# Patient Record
Sex: Female | Born: 1955 | Race: Black or African American | Hispanic: No | Marital: Married | Smoking: Current every day smoker
Health system: Southern US, Community
[De-identification: ages and names within clinical notes are randomized; demographics above are authoritative.]

## PROBLEM LIST (undated history)

## (undated) DIAGNOSIS — J45909 Unspecified asthma, uncomplicated: Secondary | ICD-10-CM

## (undated) DIAGNOSIS — E785 Hyperlipidemia, unspecified: Secondary | ICD-10-CM

## (undated) DIAGNOSIS — K219 Gastro-esophageal reflux disease without esophagitis: Secondary | ICD-10-CM

## (undated) DIAGNOSIS — E669 Obesity, unspecified: Secondary | ICD-10-CM

## (undated) DIAGNOSIS — M722 Plantar fascial fibromatosis: Secondary | ICD-10-CM

## (undated) DIAGNOSIS — I10 Essential (primary) hypertension: Secondary | ICD-10-CM

## (undated) DIAGNOSIS — D649 Anemia, unspecified: Secondary | ICD-10-CM

## (undated) DIAGNOSIS — F419 Anxiety disorder, unspecified: Secondary | ICD-10-CM

## (undated) DIAGNOSIS — R7303 Prediabetes: Secondary | ICD-10-CM

## (undated) DIAGNOSIS — E119 Type 2 diabetes mellitus without complications: Secondary | ICD-10-CM

## (undated) DIAGNOSIS — F1910 Other psychoactive substance abuse, uncomplicated: Secondary | ICD-10-CM

## (undated) DIAGNOSIS — T7840XA Allergy, unspecified, initial encounter: Secondary | ICD-10-CM

## (undated) HISTORY — DX: Type 2 diabetes mellitus without complications: E11.9

## (undated) HISTORY — DX: Plantar fascial fibromatosis: M72.2

## (undated) HISTORY — DX: Prediabetes: R73.03

## (undated) HISTORY — DX: Anxiety disorder, unspecified: F41.9

## (undated) HISTORY — DX: Other psychoactive substance abuse, uncomplicated: F19.10

## (undated) HISTORY — DX: Anemia, unspecified: D64.9

## (undated) HISTORY — DX: Obesity, unspecified: E66.9

## (undated) HISTORY — DX: Allergy, unspecified, initial encounter: T78.40XA

## (undated) HISTORY — DX: Unspecified asthma, uncomplicated: J45.909

## (undated) HISTORY — DX: Hyperlipidemia, unspecified: E78.5

## (undated) HISTORY — DX: Gastro-esophageal reflux disease without esophagitis: K21.9

---
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
## 2000-07-05 ENCOUNTER — Emergency Department (HOSPITAL_COMMUNITY): Admission: EM | Admit: 2000-07-05 | Discharge: 2000-07-05 | Payer: Self-pay | Admitting: Emergency Medicine

## 2000-07-15 ENCOUNTER — Other Ambulatory Visit: Admission: RE | Admit: 2000-07-15 | Discharge: 2000-07-15 | Payer: Self-pay | Admitting: *Deleted

## 2000-07-23 ENCOUNTER — Ambulatory Visit (HOSPITAL_COMMUNITY): Admission: RE | Admit: 2000-07-23 | Discharge: 2000-07-23 | Payer: Self-pay | Admitting: Family Medicine

## 2000-07-23 ENCOUNTER — Encounter: Payer: Self-pay | Admitting: Family Medicine

## 2004-06-27 ENCOUNTER — Emergency Department (HOSPITAL_COMMUNITY): Admission: EM | Admit: 2004-06-27 | Discharge: 2004-06-27 | Payer: Self-pay | Admitting: Emergency Medicine

## 2006-07-10 ENCOUNTER — Emergency Department (HOSPITAL_COMMUNITY): Admission: EM | Admit: 2006-07-10 | Discharge: 2006-07-10 | Payer: Self-pay | Admitting: Family Medicine

## 2007-07-27 ENCOUNTER — Emergency Department (HOSPITAL_COMMUNITY): Admission: EM | Admit: 2007-07-27 | Discharge: 2007-07-27 | Payer: Self-pay | Admitting: Family Medicine

## 2008-01-06 ENCOUNTER — Emergency Department (HOSPITAL_COMMUNITY): Admission: EM | Admit: 2008-01-06 | Discharge: 2008-01-06 | Payer: Self-pay | Admitting: Emergency Medicine

## 2008-08-22 ENCOUNTER — Ambulatory Visit: Payer: Self-pay | Admitting: *Deleted

## 2008-08-22 ENCOUNTER — Encounter (INDEPENDENT_AMBULATORY_CARE_PROVIDER_SITE_OTHER): Payer: Self-pay | Admitting: Internal Medicine

## 2008-08-22 ENCOUNTER — Encounter: Payer: Self-pay | Admitting: Internal Medicine

## 2008-08-22 DIAGNOSIS — R350 Frequency of micturition: Secondary | ICD-10-CM | POA: Insufficient documentation

## 2008-08-22 DIAGNOSIS — F17200 Nicotine dependence, unspecified, uncomplicated: Secondary | ICD-10-CM | POA: Insufficient documentation

## 2008-08-22 DIAGNOSIS — M25519 Pain in unspecified shoulder: Secondary | ICD-10-CM | POA: Insufficient documentation

## 2008-08-22 DIAGNOSIS — D509 Iron deficiency anemia, unspecified: Secondary | ICD-10-CM | POA: Insufficient documentation

## 2008-08-22 DIAGNOSIS — N898 Other specified noninflammatory disorders of vagina: Secondary | ICD-10-CM | POA: Insufficient documentation

## 2008-08-22 DIAGNOSIS — N926 Irregular menstruation, unspecified: Secondary | ICD-10-CM | POA: Insufficient documentation

## 2008-08-22 DIAGNOSIS — J329 Chronic sinusitis, unspecified: Secondary | ICD-10-CM | POA: Insufficient documentation

## 2008-08-22 DIAGNOSIS — L280 Lichen simplex chronicus: Secondary | ICD-10-CM | POA: Insufficient documentation

## 2008-08-22 LAB — CONVERTED CEMR LAB

## 2008-08-23 LAB — CONVERTED CEMR LAB
ALT: 12 units/L (ref 0–35)
AST: 34 units/L (ref 0–37)
Albumin: 4.2 g/dL (ref 3.5–5.2)
Alkaline Phosphatase: 73 units/L (ref 39–117)
BUN: 10 mg/dL (ref 6–23)
Bilirubin Urine: NEGATIVE
CO2: 22 meq/L (ref 19–32)
Calcium: 8.6 mg/dL (ref 8.4–10.5)
Candida species: NEGATIVE
Chlamydia, DNA Probe: NEGATIVE
Chloride: 107 meq/L (ref 96–112)
Creatinine, Ser: 0.75 mg/dL (ref 0.40–1.20)
GC Probe Amp, Genital: NEGATIVE
Gardnerella vaginalis: POSITIVE — AB
Glucose, Bld: 86 mg/dL (ref 70–99)
HCT: 27.6 % — ABNORMAL LOW (ref 36.0–46.0)
Hemoglobin, Urine: NEGATIVE
Hemoglobin: 7.6 g/dL — CL (ref 12.0–15.0)
Ketones, ur: NEGATIVE mg/dL
Leukocytes, UA: NEGATIVE
MCHC: 27.5 g/dL — ABNORMAL LOW (ref 30.0–36.0)
MCV: 65.4 fL — ABNORMAL LOW (ref 78.0–100.0)
Nitrite: NEGATIVE
Platelets: 440 10*3/uL — ABNORMAL HIGH (ref 150–400)
Potassium: 4 meq/L (ref 3.5–5.3)
Protein, ur: NEGATIVE mg/dL
RBC: 4.22 M/uL (ref 3.87–5.11)
RDW: 20.6 % — ABNORMAL HIGH (ref 11.5–15.5)
Sodium: 139 meq/L (ref 135–145)
Specific Gravity, Urine: 1.013 (ref 1.005–1.03)
Total Bilirubin: 0.4 mg/dL (ref 0.3–1.2)
Total Protein: 7 g/dL (ref 6.0–8.3)
Trichomonal Vaginitis: NEGATIVE
Urine Glucose: NEGATIVE mg/dL
Urobilinogen, UA: 0.2 (ref 0.0–1.0)
WBC: 5.6 10*3/uL (ref 4.0–10.5)
pH: 5.5 (ref 5.0–8.0)

## 2008-08-27 ENCOUNTER — Telehealth (INDEPENDENT_AMBULATORY_CARE_PROVIDER_SITE_OTHER): Payer: Self-pay | Admitting: Internal Medicine

## 2008-09-03 ENCOUNTER — Ambulatory Visit: Payer: Self-pay | Admitting: Infectious Diseases

## 2008-09-03 ENCOUNTER — Encounter (INDEPENDENT_AMBULATORY_CARE_PROVIDER_SITE_OTHER): Payer: Self-pay | Admitting: Internal Medicine

## 2008-09-04 LAB — CONVERTED CEMR LAB
Band Neutrophils: 1 % (ref 0–10)
Basophils Relative: 1 % (ref 0–1)
Eosinophils Relative: 1 % (ref 0–5)
Ferritin: 10 ng/mL (ref 10–291)
HCT: 27.6 % — ABNORMAL LOW (ref 36.0–46.0)
Hemoglobin: 8.7 g/dL — ABNORMAL LOW (ref 12.0–15.0)
Lymphocytes Relative: 25 % (ref 12–46)
MCHC: 31.7 g/dL (ref 30.0–36.0)
MCV: 64.2 fL — ABNORMAL LOW (ref 78.0–100.0)
Monocytes Relative: 5 % (ref 3–12)
Neutrophils Relative %: 67 % (ref 43–77)
Platelets: 430 10*3/uL — ABNORMAL HIGH (ref 150–400)
RBC: 4.3 M/uL (ref 3.87–5.11)
RDW: 23.2 % — ABNORMAL HIGH (ref 11.5–15.5)
WBC: 5.5 10*3/uL (ref 4.0–10.5)

## 2008-09-17 ENCOUNTER — Ambulatory Visit: Payer: Self-pay | Admitting: Gastroenterology

## 2008-10-05 HISTORY — PX: COLONOSCOPY: SHX174

## 2008-10-09 ENCOUNTER — Ambulatory Visit: Payer: Self-pay | Admitting: Gastroenterology

## 2008-10-09 ENCOUNTER — Encounter (INDEPENDENT_AMBULATORY_CARE_PROVIDER_SITE_OTHER): Payer: Self-pay | Admitting: Internal Medicine

## 2008-10-22 ENCOUNTER — Ambulatory Visit (HOSPITAL_COMMUNITY): Admission: RE | Admit: 2008-10-22 | Discharge: 2008-10-22 | Payer: Self-pay | Admitting: Obstetrics

## 2008-10-22 ENCOUNTER — Encounter: Payer: Self-pay | Admitting: Internal Medicine

## 2008-11-05 HISTORY — PX: ABDOMINAL HYSTERECTOMY: SHX81

## 2008-12-14 ENCOUNTER — Inpatient Hospital Stay (HOSPITAL_COMMUNITY): Admission: RE | Admit: 2008-12-14 | Discharge: 2008-12-17 | Payer: Self-pay | Admitting: Obstetrics

## 2008-12-14 ENCOUNTER — Encounter: Payer: Self-pay | Admitting: Obstetrics

## 2009-04-16 ENCOUNTER — Emergency Department (HOSPITAL_COMMUNITY): Admission: EM | Admit: 2009-04-16 | Discharge: 2009-04-16 | Payer: Self-pay | Admitting: Emergency Medicine

## 2010-11-04 NOTE — Procedures (Signed)
Summary: Colonoscopy   Colonoscopy  Procedure date:  10/09/2008  Findings:      Location:  [facility].    Procedures Next Due Date:    Colonoscopy: 10/2018  COLONOSCOPY PROCEDURE REPORT  PATIENT:  Terri Powers, Terri Powers  MR#:  [id number] BIRTHDATE:   July 20, 1956   GENDER:   female  ENDOSCOPIST:   Rachael Fee, MD Referred by: Clydie Braun, M.D.  PROCEDURE DATE:  10/09/2008 PROCEDURE:  Colonoscopy, diagnostic ASA CLASS:   Class II INDICATIONS: colorectal cancer screening, average risk   MEDICATIONS:    Versed 10 mg IV, Fentanyl 75 mcg IV  DESCRIPTION OF PROCEDURE:   After the risks benefits and alternatives of the procedure were thoroughly explained, informed consent was obtained.  Digital rectal exam was performed and revealed no abnormalities.   The LB CF-[id number] endoscope was introduced through the anus and advanced to the cecum, which was identified by both the appendix and ileocecal valve, without limitations.  The quality of the prep was good, using MoviPrep.  The instrument was then slowly withdrawn as the colon was fully examined. <<PROCEDUREIMAGES>>    <<OLD IMAGES>>  FINDINGS:  External hemorrhoids were found. Small external hemorrhoids.  The examination was otherwise normal (see image2, image3, and image4).  No polyps or cancers were seen.   Retroflexed views in the rectum revealed no abnormalities.    The scope was then withdrawn from the patient and the procedure completed.  COMPLICATIONS:   None  ENDOSCOPIC IMPRESSION:  1) External hemorrhoids  2) Otherwise normal examination  3) No polyps or cancers  RECOMMENDATIONS:  1) Should follow current colorectal cancer screening guidelines for routine risk patients with a repeat colonoscopy in 10 years.      REPEAT EXAM:   In 10 year(s) for Colonoscopy.   _______________________________ Rachael Fee, MD   CC: Clydie Braun, MD

## 2010-11-04 NOTE — Assessment & Plan Note (Signed)
Summary: COMPL PHYSICAL/PAP SMEAR/VS   Vital Signs:  Patient Profile:   55 Years Old Female Height:     64 inches (162.56 cm) Weight:      216.06 pounds (98.21 kg) BMI:     37.22 Temp:     97.3 degrees F (36.28 degrees C) oral Pulse rate:   73 / minute BP sitting:   131 / 75  (right arm)  Vitals Entered By: Angelina Ok RN (August 22, 2008 10:55 AM)             Is Patient Diabetic? No Nutritional Status BMI of > 30 = obese  Have you ever been in a relationship where you felt threatened, hurt or afraid?No   Does patient need assistance? Functional Status Self care Ambulation Normal     Chief Complaint:  Physical and check up.  History of Present Illness: Terri Powers (this is how she would like to be addressed) presents for a first visit in our clinic for a complete check-up. Her husband was in the hospital under our care and she decided to come to our clinic, too. She did not have a physical in several years.  She has irregular menstrual cycles: every other month, but when she has them they are heavy. She does not have a gynecologist. She has hot flushes. She also notices an unpleasant smell of her vaginal secretions for last 6 months. Agrees for STD testing. She has sinus drainage and coughs constantly. Wonders if this is bronchitis. She has urine loss when coughs. She urinates more often, too. No dysuria. She has a rash on left calf - on and off. Itching, no pain.  Has upper back and bilat. shoulder pain. She would want the flu vaccine today.  Did not have a colonoscopy.She had 1 mammogram in last 10 years.   Depression History:      The patient denies a depressed mood most of the day and a diminished interest in her usual daily activities.        The patient denies that she feels like life is not worth living, denies that she wishes that she were dead, and denies that she has thought about ending her life.            Current Allergies: * HEPATITIS VACCINE  Past  Medical History:    H/o plantar fasciitits    Anemia-iron deficiency 2/2 uterine fibroids     h/o BV 2007    cough 2/2 sinus drainage    allergic rhinitis (spring and autumn spx)       Family History:    Mother - Diabetes, HTN, CHF (b. 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  Sometimes get constipation   Physical Exam  General:     alert and overweight-appearing.   Eyes:     vision grossly intact, pupils equal, pupils round, and pupils reactive to light.   Mouth:     good dentition (except one painful tooth) and pharynx pink and moist.   Lungs:     normal respiratory effort, no crackles, and no wheezes.   Heart:     normal rate, regular rhythm, no murmur, and no gallop.   Abdomen:     soft, non-tender, and normal bowel sounds.   Genitalia:     Pelvic Exam:        External: normal female genitalia without lesions or masses. Mild urethral prolapse.        Vagina: normal without  masses, few small whitish adherent plaques near cervix plaques         Cervix: one punctiform reddish lesion at 12 o'clock        Adnexa: normal bimanual exam without masses or fullness        Uterus: a little dense by internal palpation        Pap  smear: performed Extremities:     no edema Skin:     whole left medial calf area covered by a violaceous, micropapular, pruriginous rash Psych:     normally interactive (a little nervous), good eye contact    Impression & Recommendations:  Problem # 1:  ANEMIA-IRON DEFICIENCY (ICD-280.9) Pt's last documented Hb was 8.8 at the time of an ED visit in 2007 I believe. She did not have a hysteresctomy and notices that her periods are less frequent. I d/w her to await the Hb results and if very low, to refer to Gyn. Orders: T-CBC No Diff ([id number])   Problem # 2:  HEALTH MAINTENANCE EXAM (ICD-V70.0) Will order the routine screening test for this 55 y/o woman (see below).  Will give flu vaccine. Orders: T-Comprehensive Metabolic Panel [phone]) T-CBC No Diff ([id number]) T-Pap Smear, Thin Prep (Y3016) Colonoscopy (Colon) Mammogram (Screening) (Mammo)   Problem # 3:  FREQUENCY, URINARY (ICD-788.41) Pt c/o urinary frequency and having urinary loss when coughing. She has a mild urethral prolapse and it is conceivable to have some degree of incontinence. Will checka  U/A first to see if any UTI. 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 She has had it for a long time and "comes and goes". She has other spots on her body but on much more limited skin areas. Since this is an allergic type of rash, will give HoCortisone cream and advise to try not to scratch. If persists and pruritus worse, can give Atarax/Benadryl. Also, at next visit, advise to wrap leg at night to keep moist (can even use saran wrap).  Problem # 5:  VAGINAL DISCHARGE (ICD-623.5) She c/o malodorous vaginal d/c. Agrees for STD testing. Had clue cell on a wet prep in 2007. Orders: T- GC Chlamydia ([id number]) T-Wet Prep by [facility] [phone])   Problem # 6:  TOBACCO ABUSE  (ICD-305.1) We discussed briefly about the health hazards of tobacco abuse on all body systems. She already cut down to 1/2 PPD from 1 PPD.  Will readdress at next visit.  Problem # 7:  SHOULDER PAIN, BILATERAL (ICD-719.41) Most likely positional. Pt works behind a desk and we discussed position and upper body strengthening exercises with 2, then 5 lb weights if tolerates.  Problem # 8:  POSTNASAL DRIP SYNDROME (ICD-473.9) Pt c/o postnasal drip and cough. Most severe in spring and fall. She is wondering whether this can be bronchitis. I doubt it, but she can have chronic bronchitis changes from smoking. I will not start a cough syrup but I advised her to try an OTC cough supressant for several days but not more than that.   Medications Added to Medication List This Visit: 1)  Cvs Loratadine 10 Mg Tabs (Loratadine) .... Take 1 tablet by mouth once a day 2)  Anti-itch Maximum Strength 1 % Crea (Hydrocortisone) .... Apply as instructed on the tube  Complete Medication List: 1)  Cvs Loratadine 10 Mg Tabs (Loratadine) .... Take 1 tablet by mouth once a day 2)  Anti-itch Maximum Strength 1 % Crea (Hydrocortisone) .... Apply as instructed on the tube  More than 30 min was spent for this encounter.  Patient Instructions: 1)  Please schedule a follow-up appointment in 6 months.   Prescriptions: ANTI-ITCH MAXIMUM STRENGTH 1 % CREA (HYDROCORTISONE) apply as instructed on the tube  #1 x 1   Entered and Authorized by:   Carlus Pavlov MD   Signed by:   Carlus Pavlov MD on 08/22/2008   Method used:   Print then Give to Patient   RxID:   [id number]  ]

## 2011-01-11 LAB — GLUCOSE, CAPILLARY: Glucose-Capillary: 106 mg/dL — ABNORMAL HIGH (ref 70–99)

## 2011-01-15 LAB — CBC
HCT: 30.3 % — ABNORMAL LOW (ref 36.0–46.0)
HCT: 34.3 % — ABNORMAL LOW (ref 36.0–46.0)
Hemoglobin: 11 g/dL — ABNORMAL LOW (ref 12.0–15.0)
Hemoglobin: 9.8 g/dL — ABNORMAL LOW (ref 12.0–15.0)
MCHC: 32 g/dL (ref 30.0–36.0)
MCHC: 32.3 g/dL (ref 30.0–36.0)
MCV: 76 fL — ABNORMAL LOW (ref 78.0–100.0)
MCV: 77.3 fL — ABNORMAL LOW (ref 78.0–100.0)
Platelets: 296 10*3/uL (ref 150–400)
Platelets: 349 10*3/uL (ref 150–400)
RBC: 3.91 MIL/uL (ref 3.87–5.11)
RBC: 4.51 MIL/uL (ref 3.87–5.11)
RDW: 24.5 % — ABNORMAL HIGH (ref 11.5–15.5)
RDW: 25.2 % — ABNORMAL HIGH (ref 11.5–15.5)
WBC: 10.9 10*3/uL — ABNORMAL HIGH (ref 4.0–10.5)
WBC: 5.3 10*3/uL (ref 4.0–10.5)

## 2011-01-15 LAB — PREGNANCY, URINE: Preg Test, Ur: NEGATIVE

## 2011-01-24 ENCOUNTER — Encounter: Payer: Self-pay | Admitting: Cardiovascular Disease

## 2011-01-26 ENCOUNTER — Other Ambulatory Visit (HOSPITAL_COMMUNITY): Payer: Self-pay | Admitting: Cardiovascular Disease

## 2011-01-26 DIAGNOSIS — R55 Syncope and collapse: Secondary | ICD-10-CM

## 2011-01-27 ENCOUNTER — Institutional Professional Consult (permissible substitution): Payer: Self-pay | Admitting: Cardiovascular Disease

## 2011-01-27 ENCOUNTER — Other Ambulatory Visit (HOSPITAL_COMMUNITY): Payer: Self-pay | Admitting: Radiology

## 2011-02-10 ENCOUNTER — Encounter: Payer: Self-pay | Admitting: Cardiovascular Disease

## 2011-02-17 NOTE — Discharge Summary (Signed)
Terri Powers, Terri Powers     ACCOUNT NO.:  [id number]   MEDICAL RECORD NO.:  [id number]          PATIENT TYPE:  INP   LOCATION:  9317                          FACILITY:  [facility]   PHYSICIAN:  Charles A. Clearance Coots, M.D.DATE OF BIRTH:  05/18/1956   DATE OF ADMISSION:  12/14/2008  DATE OF DISCHARGE:  12/17/2008                               DISCHARGE SUMMARY   ADMITTING DIAGNOSIS:  Symptomatic uterine fibroids, menorrhagia.   DISCHARGE DIAGNOSIS:  Symptomatic uterine fibroids, menorrhagia.   REASON FOR ADMISSION:  A 55 year old black female presents with a  history of heavy painful periods.  The patient desired definitive  surgical management.   PAST MEDICAL HISTORY:  Surgery none.   ILLNESSES:  Anemia and seasonal allergies.   MEDICATIONS:  Colace, iron, and Claritin.   ALLERGIES:  No known drug allergies   SOCIAL HISTORY:  Married.  Negative alcohol or recreational drug use.  Positive tobacco.   FAMILY HISTORY:  Remarkable for diabetes.   PHYSICAL EXAMINATION:  GENERAL:  Obese black female in no acute  distress.  VITAL SIGNS:  Temperature 98.4, pulse 80, respiratory rate 18, and blood  pressure 142/80.  HEENT:  Within normal limits.  LUNGS:  Clear to auscultation bilaterally.  HEART:  Regular rate and rhythm.  ABDOMEN:  Soft and nontender.  PELVIC:  Normal external female genitalia.  Vaginal mucosa is normal.  Cervix without discharge, bleeding, or lesions.  Uterus, 13-14 weeks'  size.  Mid position.  There were no adnexal masses appreciated.   IMPRESSION:  Symptomatic uterine fibroids, menorrhagia.   PLAN:  Total abdominal hysterectomy.   ADMITTING LABS:  Hemoglobin 11, hematocrit 34, white blood cell count  5300, and platelets 349,000.   HOSPITAL COURSE:  The patient underwent total abdominal hysterectomy on  December 14, 2008.  There were no intraoperative complications.  Postoperative course was uncomplicated.  The patient was discharged home  on postop day #3 in  good condition.   DISCHARGE LABS:  Hemoglobin 9.8, hematocrit 30, white blood cell count  10,900, and platelets 296,000.   DISCHARGE DISPOSITION:  Discharged home in good condition, status post  total abdominal hysterectomy.   MEDICATIONS:  Ibuprofen and Percocet were prescribed for pain.  Continue  iron.   DISCHARGE INSTRUCTIONS:  Routine written instructions were given for  discharge after hysterectomy.  The patient is to the call office for  followup appointment in 2 weeks.      Charles A. Clearance Coots, M.D.  Electronically Signed     CAH/MEDQ  D:  12/17/2008  T:  12/17/2008  Job:  [id number]

## 2011-02-17 NOTE — Op Note (Signed)
Terri Powers, Terri Powers     ACCOUNT NO.:  [id number]   MEDICAL RECORD NO.:  [id number]          PATIENT TYPE:  INP   LOCATION:  9317                          FACILITY:  [facility]   PHYSICIAN:  Charles A. Clearance Coots, M.D.DATE OF BIRTH:  30-Dec-1955   DATE OF PROCEDURE:  12/14/2008  DATE OF DISCHARGE:                               OPERATIVE REPORT   PREOPERATIVE DIAGNOSES:  Symptomatic uterine fibroids, menorrhagia.   POSTOPERATIVE DIAGNOSES:  Symptomatic uterine fibroids, menorrhagia.   PROCEDURE:  Total abdominal hysterectomy.   SURGEON:  Charles A. Clearance Coots, MD   ASSISTANT:  Roseanna Rainbow, MD   ANESTHESIA:  General.   ESTIMATED BLOOD LOSS:  200 mL.   IV FLUIDS:  42,100 mL.   URINE OUTPUT:  400 mL clear.   COMPLICATIONS:  None.  Foley to gravity.   FINDINGS:  Uterine fibroids.   SPECIMEN:  Uterus with cervix, weight equals 330 g.   DISPOSITION:  Specimen pathology.   OPERATION:  The patient was brought to the operating room and after  satisfactory general endotracheal anesthesia.  The abdomen was prepped  and draped in the usual sterile fashion.  Pfannenstiel skin incision was  made with the scalpel that was deepened down to the fascia with a  scalpel.  Fascia was nicked in the midline and the fascial incision was  extended to the left and to the right with curved Mayo scissors.  The  superior and inferior fascial edges were taken off the rectus muscle  with both blunt sharp dissection.  The rectus muscle was sharply divided  in the midline.  Peritoneum was entered digitally and was digitally  extended to the left and to the right.  The O'Connor-O'Sullivan  retractor was then placed in the incision.  The bowel was packed off and  the anterior and posterior blades were placed.  The cornu of the uterus  was then grasped with Kelly forceps.  The uterus was then elevated.  The  round ligaments were grasped with Kelly forceps and transected with the  Bovie and suture  ligated with transfixion sutures of 0 Vicryl  bilaterally.  The anterior vesicouterine fold of the peritoneum was then  incised with the Bovie and the urinary bladder was pushed down and away  from the lower uterine segment and the cervix.  The broad ligament was  then tented medially, digitally, developing an opening.  The parametrial  clamps were then placed across the utero-ovarian broad ligament and  fallopian tube with a double clamp technique.  The pedicle was then  transected and free tie of 0 Vicryl was placed beneath the clamp  followed by suture ligation with transfixion suture of 0 Vicryl.  Same  procedure was performed on the opposite side without complications.  The  uterine vessels were then isolated and skeletonized bilaterally, doubly  clamped, cut, and suture ligated with transfixion sutures of 0 Vicryl  bilaterally.  The cardinal ligaments were then serially clamped, cut,  and suture ligated with transfixion sutures of 0 Vicryl bilaterally.  The uterosacral ligaments were then clamped, cut, and suture ligated  with transfixion sutures of 0 Vicryl bilaterally and these were held for  further closure of the cuff.  The vaginal cuff was then cross clamped  with 2 parametrial clamps from each corner meeting in the center.  The  cervix and the uterus was then transected away from the vagina and  submitted to pathology for evaluation.  The corners of the vaginal cuff  were closed with transfixion sutures of 0 Vicryl.  The center of the  vaginal cuff was closed with interrupted sutures of 0 Vicryl.  Hemostasis was excellent.  Pelvic cavity was thoroughly irrigated with  warm saline solution.  All clots were removed.  Closure of the vaginal  cuff and all pedicles were again observed for hemostasis, and there was  no active bleeding noted.  The O'Connor-O'Sullivan retractor was then  removed and all packing was removed.  Surgical technician indicated that  all needle, sponge, and  instrument counts were correct.  The abdomen was  then closed as follows.  The parietal peritoneum was closed with  continuous suture of 2-0 Vicryl.  Fascia was closed with continuous  suture of 0 PDS from each corner to the center.  Subcutaneous tissue was  thoroughly irrigated with warm saline solution and all areas of  subcutaneous bleeding were coagulated with the Bovie.  The skin was then  closed with a continuous subcuticular suture of 4-0 Vicryl.  Sterile  bandage was applied to the incision closure.  Surgical technician again  indicated that all needle, sponge, and instrument counts were correct.  The patient tolerated the procedure well, was transported to recovery  room in satisfactory condition.      Charles A. Clearance Coots, M.D.  Electronically Signed     CAH/MEDQ  D:  12/14/2008  T:  12/15/2008  Job:  [id number]

## 2011-08-30 ENCOUNTER — Emergency Department (INDEPENDENT_AMBULATORY_CARE_PROVIDER_SITE_OTHER): Payer: BC Managed Care – PPO

## 2011-08-30 ENCOUNTER — Emergency Department (INDEPENDENT_AMBULATORY_CARE_PROVIDER_SITE_OTHER)
Admission: EM | Admit: 2011-08-30 | Discharge: 2011-08-30 | Disposition: A | Discharge status: Home / Self Care | Payer: BC Managed Care – PPO | Source: Home / Self Care | Attending: Family Medicine | Admitting: Family Medicine

## 2011-08-30 ENCOUNTER — Encounter (HOSPITAL_COMMUNITY): Payer: Self-pay | Admitting: *Deleted

## 2011-08-30 DIAGNOSIS — S90121A Contusion of right lesser toe(s) without damage to nail, initial encounter: Secondary | ICD-10-CM

## 2011-08-30 DIAGNOSIS — S90129A Contusion of unspecified lesser toe(s) without damage to nail, initial encounter: Secondary | ICD-10-CM

## 2011-08-30 NOTE — ED Notes (Signed)
Reports stubbing right 2nd toe at work approx 3-4 days ago.  C/O continued pain and swelling.  Has been taking IBU.

## 2011-08-30 NOTE — ED Provider Notes (Signed)
History     CSN: [id number] Arrival date & time: 08/30/2011  9:37 AM   First MD Initiated Contact with Patient 08/30/11 [phone]      Chief Complaint  Patient presents with  . Toe Injury    (Consider location/radiation/quality/duration/timing/severity/associated sxs/prior treatment) Patient is a 55 y.o. female presenting with toe pain.  Toe Pain This is a new problem. The current episode started more than 2 days ago (struck object on tues, still painful.). The problem occurs constantly. The problem has not changed since onset.The symptoms are aggravated by walking.    Past Medical History  Diagnosis Date  . Plantar fasciitis   . Anemia   . Allergic rhinitis     Past Surgical History  Procedure Date  . Abdominal hysterectomy     Family History  Problem Relation Age of Onset  . Diabetes    . Hypertension    . Heart failure    . Kidney cancer      History  Substance Use Topics  . Smoking status: Current Everyday Smoker -- 0.5 packs/day  . Smokeless tobacco: Not on file  . Alcohol Use: Yes     Occasional use    OB History    Grav Para Term Preterm Abortions TAB SAB Ect Mult Living                  Review of Systems  Constitutional: Negative.   Musculoskeletal: Positive for joint swelling and gait problem.  Skin: Negative.     Allergies  Shellfish allergy  Home Medications  No current outpatient prescriptions on file.  BP 147/78  Pulse 79  Temp(Src) 98.1 F (36.7 C) (Oral)  Resp 17  SpO2 100%  Physical Exam  Nursing note and vitals reviewed. Constitutional: She appears well-developed and well-nourished.  Musculoskeletal:       Feet:    ED Course  Procedures (including critical care time)  Labs Reviewed - No data to display No results found.   1. Contusion of toe of right foot       MDM  X-rays reviewed and report per radiologist.         Barkley Bruns, MD 09/18/11 [phone]

## 2013-05-22 ENCOUNTER — Emergency Department (HOSPITAL_COMMUNITY)
Admission: EM | Admit: 2013-05-22 | Discharge: 2013-05-22 | Disposition: A | Discharge status: Home / Self Care | Payer: BC Managed Care – PPO | Source: Home / Self Care | Attending: Emergency Medicine | Admitting: Emergency Medicine

## 2013-05-22 ENCOUNTER — Encounter (HOSPITAL_COMMUNITY): Payer: Self-pay | Admitting: *Deleted

## 2013-05-22 DIAGNOSIS — H1089 Other conjunctivitis: Secondary | ICD-10-CM

## 2013-05-22 DIAGNOSIS — A499 Bacterial infection, unspecified: Secondary | ICD-10-CM

## 2013-05-22 DIAGNOSIS — H109 Unspecified conjunctivitis: Secondary | ICD-10-CM

## 2013-05-22 MED ORDER — OFLOXACIN 0.3 % OP SOLN: 2.0000 [drp] | Freq: Four times a day (QID) | OPHTHALMIC | Status: DC

## 2013-05-22 MED ORDER — TETRACAINE HCL 0.5 % OP SOLN
OPHTHALMIC | Status: AC
  Filled 2013-05-22: qty 2

## 2013-05-22 NOTE — ED Provider Notes (Signed)
CSN: [id number]     Arrival date & time 05/22/13  1226 History     First MD Initiated Contact with Patient 05/22/13 1311     Chief Complaint  Patient presents with  . Eye Problem   (Consider location/radiation/quality/duration/timing/severity/associated sxs/prior Treatment) HPI Comments: Foreign body sensation, gloopy eyes, red right eye for a few days  57 year old female presents complaining of increasing irritation in her right eye with purulent discharge and foreign body sensation for the past several days. She does have sick contacts with conjunctivitis and she believe she may have caught it. She has not tried doing anything to treat this problem at home. She denies blurry vision, pain in the eyeball itself, fever, chills, headache, or any other symptoms. She does wear contacts but has not used them since this began and she did throw away the contacts she was using when this began  Patient is a 57 y.o. female presenting with eye problem.  Eye Problem Associated symptoms: discharge and redness   Associated symptoms: no nausea, no photophobia, no vomiting and no weakness     Past Medical History  Diagnosis Date  . Plantar fasciitis   . Anemia   . Allergic rhinitis    Past Surgical History  Procedure Laterality Date  . Abdominal hysterectomy     Family History  Problem Relation Age of Onset  . Diabetes    . Hypertension    . Heart failure    . Kidney cancer     History  Substance Use Topics  . Smoking status: Current Every Day Smoker -- 0.50 packs/day  . Smokeless tobacco: Not on file  . Alcohol Use: Yes     Comment: Occasional use   OB History   Grav Para Term Preterm Abortions TAB SAB Ect Mult Living                 Review of Systems  Constitutional: Negative for fever and chills.  Eyes: Positive for pain, discharge and redness. Negative for photophobia and visual disturbance.  Respiratory: Negative for cough and shortness of breath.   Cardiovascular: Negative  for chest pain, palpitations and leg swelling.  Gastrointestinal: Negative for nausea, vomiting and abdominal pain.  Endocrine: Negative for polydipsia and polyuria.  Genitourinary: Negative for dysuria, urgency and frequency.  Musculoskeletal: Negative for myalgias and arthralgias.  Skin: Negative for rash.  Neurological: Negative for dizziness, weakness and light-headedness.    Allergies  Shellfish allergy  Home Medications   Current Outpatient Rx  Name  Route  Sig  Dispense  Refill  . ofloxacin (OCUFLOX) 0.3 % ophthalmic solution   Right Eye   Place 2 drops into the right eye 4 (four) times daily.   10 mL   0    BP 150/79  Pulse 85  Temp(Src) 98.1 F (36.7 C) (Oral)  Resp 16  SpO2 100% Physical Exam  Nursing note and vitals reviewed. Constitutional: She is oriented to person, place, and time. She appears well-developed and well-nourished. No distress.  HENT:  Head: Normocephalic and atraumatic.  Eyes: EOM are normal. Right eye exhibits discharge. Left eye exhibits no discharge. Right conjunctiva is injected. Left conjunctiva is not injected.  Slit lamp exam:      The right eye shows no corneal abrasion, no corneal ulcer, no foreign body and no fluorescein uptake.  Pulmonary/Chest: Effort normal.  Neurological: She is alert and oriented to person, place, and time.  Skin: Skin is warm and dry. No rash noted. She is not  diaphoretic.  Psychiatric: She has a normal mood and affect. Judgment normal.    ED Course   Procedures (including critical care time)  Labs Reviewed - No data to display No results found. 1. Bacterial conjunctivitis of right eye     MDM  Bacterial conjunctivitis, no evidence of corneal ulceration or abrasion, or foreign body. Contact use, Ocuflox, followup with eye doctor if not improving  Meds ordered this encounter  Medications  . ofloxacin (OCUFLOX) 0.3 % ophthalmic solution    Sig: Place 2 drops into the right eye 4 (four) times daily.     Dispense:  10 mL    Refill:  0     Graylon Good, PA-C 05/23/13 1045

## 2013-05-22 NOTE — ED Notes (Signed)
Pt  Reports  r  Eye  Is  Red  Irritated      X  sev  Days  The  Pt  Reports  She  Wears  Contacts  -    She  Removed  The  Contacts        She  denys  Any other  Symptoms   She  Is       Sitting  Upright on  The  Exam table       Sitting up and  Is  In no  Acute  Distress

## 2013-05-23 NOTE — ED Provider Notes (Signed)
Medical screening examination/treatment/procedure(s) were performed by non-physician practitioner and as supervising physician I was immediately available for consultation/collaboration.  Raydan Schlabach, M.D.  Marceil Welp C Casady Voshell, MD 05/23/13 1707 

## 2014-01-17 ENCOUNTER — Emergency Department (HOSPITAL_COMMUNITY)
Admission: EM | Admit: 2014-01-17 | Discharge: 2014-01-17 | Disposition: A | Discharge status: Home / Self Care | Payer: BC Managed Care – PPO | Source: Home / Self Care

## 2014-01-17 ENCOUNTER — Encounter (HOSPITAL_COMMUNITY): Payer: Self-pay | Admitting: Emergency Medicine

## 2014-01-17 DIAGNOSIS — J309 Allergic rhinitis, unspecified: Secondary | ICD-10-CM

## 2014-01-17 DIAGNOSIS — J45909 Unspecified asthma, uncomplicated: Secondary | ICD-10-CM

## 2014-01-17 MED ORDER — ALBUTEROL SULFATE HFA 108 (90 BASE) MCG/ACT IN AERS: 1.0000 | INHALATION_SPRAY | Freq: Four times a day (QID) | RESPIRATORY_TRACT | Status: DC | PRN

## 2014-01-17 MED ORDER — TRIAMCINOLONE ACETONIDE 40 MG/ML IJ SUSP
INTRAMUSCULAR | Status: AC
  Filled 2014-01-17: qty 2

## 2014-01-17 MED ORDER — TRIAMCINOLONE ACETONIDE 40 MG/ML IJ SUSP
60.0000 mg | Freq: Once | INTRAMUSCULAR | Status: AC
  Administered 2014-01-17: 60 mg via INTRAMUSCULAR

## 2014-01-17 NOTE — ED Notes (Signed)
Congestion, cough, allergies x 1 week

## 2014-01-17 NOTE — ED Provider Notes (Signed)
CSN: [id number]     Arrival date & time 01/17/14  1045 History   First MD Initiated Contact with Patient 01/17/14 1200     Chief Complaint  Patient presents with  . Nasal Congestion  . Cough   (Consider location/radiation/quality/duration/timing/severity/associated sxs/prior Treatment) HPI Comments: C/O allergies that occur seasonally.   Past Medical History  Diagnosis Date  . Plantar fasciitis   . Anemia   . Allergic rhinitis    Past Surgical History  Procedure Laterality Date  . Abdominal hysterectomy     Family History  Problem Relation Age of Onset  . Diabetes    . Hypertension    . Heart failure    . Kidney cancer     History  Substance Use Topics  . Smoking status: Current Every Day Smoker -- 0.50 packs/day  . Smokeless tobacco: Not on file  . Alcohol Use: Yes     Comment: Occasional use   OB History   Grav Para Term Preterm Abortions TAB SAB Ect Mult Living                 Review of Systems  Constitutional: Negative for fever, chills, activity change, appetite change and fatigue.  HENT: Positive for congestion, postnasal drip, rhinorrhea and sinus pressure. Negative for facial swelling.   Eyes: Negative.   Respiratory: Positive for cough. Negative for shortness of breath.   Cardiovascular: Negative.   Gastrointestinal: Negative.   Musculoskeletal: Negative for neck pain and neck stiffness.  Skin: Negative for pallor and rash.  Neurological: Negative.     Allergies  Shellfish allergy  Home Medications   Prior to Admission medications   Medication Sig Start Date End Date Taking? Authorizing Provider  ofloxacin (OCUFLOX) 0.3 % ophthalmic solution Place 2 drops into the right eye 4 (four) times daily. 05/22/13   Freeman Caldron Baker, PA-C   BP 157/73  Pulse 71  Temp(Src) 98 F (36.7 C) (Oral)  Resp 16  SpO2 100% Physical Exam  Nursing note and vitals reviewed. Constitutional: She is oriented to person, place, and time. She appears well-developed and  well-nourished. No distress.  HENT:  Head: Normocephalic.  Right Ear: External ear normal.  Left Ear: External ear normal.  Minor erythema of the posterior oropharynx. Cobblestoning. No exudates or edema. Clear PND  Eyes: Conjunctivae and EOM are normal.  Neck: Normal range of motion. Neck supple.  Cardiovascular: Normal rate and regular rhythm.   No murmur heard. Pulmonary/Chest: Effort normal. No respiratory distress. She has no rales.  Coarseness on expiration  Musculoskeletal: Normal range of motion.  Lymphadenopathy:    She has no cervical adenopathy.  Neurological: She is alert and oriented to person, place, and time. No cranial nerve deficit.  Skin: Skin is warm and dry.  Psychiatric: She has a normal mood and affect.    ED Course  Procedures (including critical care time) Labs Review Labs Reviewed - No data to display  No results found.   MDM   1. Allergic rhinosinusitis   2. RAD (reactive airway disease) with wheezing      Kenalog 60 mg IM Albuterol HFA OTC meds for allergies listed for her Lots of fluids      Janne Napoleon, NP 01/17/14 1248

## 2014-01-17 NOTE — Discharge Instructions (Signed)
Allergic Rhinitis Allegra 180 mg  A day Sudafed PE for congestion For night time drainage may add chlorphenermine (Chlor-Trimeton) Flonase nasal spray Lots of saline nasal spray Bronchospasm, Adult A bronchospasm is a spasm or tightening of the airways going into the lungs. During a bronchospasm breathing becomes more difficult because the airways get smaller. When this happens there can be coughing, a whistling sound when breathing (wheezing), and difficulty breathing. Bronchospasm is often associated with asthma, but not all patients who experience a bronchospasm have asthma. CAUSES  A bronchospasm is caused by inflammation or irritation of the airways. The inflammation or irritation may be triggered by:   Allergies (such as to animals, pollen, food, or mold). Allergens that cause bronchospasm may cause wheezing immediately after exposure or many hours later.   Infection. Viral infections are believed to be the most common cause of bronchospasm.   Exercise.   Irritants (such as pollution, cigarette smoke, strong odors, aerosol sprays, and paint fumes).   Weather changes. Winds increase molds and pollens in the air. Rain refreshes the air by washing irritants out. Cold air may cause inflammation.   Stress and emotional upset.  SIGNS AND SYMPTOMS   Wheezing.   Excessive nighttime coughing.   Frequent or severe coughing with a simple cold.   Chest tightness.   Shortness of breath.  DIAGNOSIS  Bronchospasm is usually diagnosed through a history and physical exam. Tests, such as chest X-rays, are sometimes done to look for other conditions. TREATMENT   Inhaled medicines can be given to open up your airways and help you breathe. The medicines can be given using either an inhaler or a nebulizer machine.  Corticosteroid medicines may be given for severe bronchospasm, usually when it is associated with asthma. HOME CARE INSTRUCTIONS   Always have a plan prepared for  seeking medical care. Know when to call your health care provider and local emergency services (911 in the [place] Know where you can access local emergency care.  Only take medicines as directed by your health care provider.  If you were prescribed an inhaler or nebulizer machine, ask your health care provider to explain how to use it correctly. Always use a spacer with your inhaler if you were given one.  It is necessary to remain calm during an attack. Try to relax and breathe more slowly.  Control your home environment in the following ways:   Change your heating and air conditioning filter at least once a month.   Limit your use of fireplaces and wood stoves.  Do not smoke and do not allow smoking in your home.   Avoid exposure to perfumes and fragrances.   Get rid of pests (such as roaches and mice) and their droppings.   Throw away plants if you see mold on them.   Keep your house clean and dust free.   Replace carpet with wood, tile, or vinyl flooring. Carpet can trap dander and dust.   Use allergy-proof pillows, mattress covers, and box spring covers.   Wash bed sheets and blankets every week in hot water and dry them in a dryer.   Use blankets that are made of polyester or cotton.   Wash hands frequently. SEEK MEDICAL CARE IF:   You have muscle aches.   You have chest pain.   The sputum changes from clear or white to yellow, green, gray, or bloody.   The sputum you cough up gets thicker.   There are problems that may be related to  the medicine you are given, such as a rash, itching, swelling, or trouble breathing.  SEEK IMMEDIATE MEDICAL CARE IF:   You have worsening wheezing and coughing even after taking your prescribed medicines.   You have increased difficulty breathing.   You develop severe chest pain. MAKE SURE YOU:   Understand these instructions.  Will watch your condition.  Will get help right away if you are not doing well  or get worse. Document Released: 09/24/2003 Document Revised: 05/24/2013 Document Reviewed: 03/13/2013 [facility] Patient Information 2014 [place].  How to Use an Inhaler Using your inhaler correctly is very important. Good technique will make sure that the medicine reaches your lungs.  HOW TO USE AN INHALER: 1. Take the cap off the inhaler. 2. If this is the first time using your inhaler, you need to prime it. Shake the inhaler for 5 seconds. Release four puffs into the air, away from your face. Ask your doctor for help if you have questions. 3. Shake the inhaler for 5 seconds. 4. Turn the inhaler so the bottle is above the mouthpiece. 5. Put your pointer finger on top of the bottle. Your thumb holds the bottom of the inhaler. 6. Open your mouth. 7. Either hold the inhaler away from your mouth (the width of 2 fingers) or place your lips tightly around the mouthpiece. Ask your doctor which way to use your inhaler. 8. Breathe out as much air as possible. 9. Breathe in and push down on the bottle 1 time to release the medicine. You will feel the medicine go in your mouth and throat. 10. Continue to take a deep breath in very slowly. Try to fill your lungs. 11. After you have breathed in completely, hold your breath for 10 seconds. This will help the medicine to settle in your lungs. If you cannot hold your breath for 10 seconds, hold it for as long as you can before you breathe out. 12. Breathe out slowly, through pursed lips. Whistling is an example of pursed lips. 13. If your doctor has told you to take more than 1 puff, wait at least 15 30 seconds between puffs. This will help you get the best results from your medicine. Do not use the inhaler more than your doctor tells you to. 14. Put the cap back on the inhaler. 15. Follow the directions from your doctor or from the inhaler package about cleaning the inhaler. If you use more than one inhaler, ask your doctor which inhalers to use and  what order to use them in. Ask your doctor to help you figure out when you will need to refill your inhaler.  If you use a steroid inhaler, always rinse your mouth with water after your last puff, gargle and spit out the water. Do not swallow the water. GET HELP IF:  The inhaler medicine only partially helps to stop wheezing or shortness of breath.  You are having trouble using your inhaler.  You have some increase in thick spit (phlegm). GET HELP RIGHT AWAY IF:  The inhaler medicine does not help your wheezing or shortness of breath or you have tightness in your chest.  You have dizziness, headaches, or fast heart rate.  You have chills, fever, or night sweats.  You have a large increase of thick spit, or your thick spit is bloody. MAKE SURE YOU:   Understand these instructions.  Will watch your condition.  Will get help right away if you are not doing well or get worse.  Document Released: 06/30/2008 Document Revised: 07/12/2013 Document Reviewed: 04/20/2013 [facility] Patient Information 2014 [place], [place].  Allergic rhinitis is when the mucous membranes in the nose respond to allergens. Allergens are particles in the air that cause your body to have an allergic reaction. This causes you to release allergic antibodies. Through a chain of events, these eventually cause you to release histamine into the blood stream. Although meant to protect the body, it is this release of histamine that causes your discomfort, such as frequent sneezing, congestion, and an itchy, runny nose.  CAUSES  Seasonal allergic rhinitis (hay fever) is caused by pollen allergens that may come from grasses, trees, and weeds. Year-round allergic rhinitis (perennial allergic rhinitis) is caused by allergens such as house dust mites, pet dander, and mold spores.  SYMPTOMS   Nasal stuffiness (congestion).  Itchy, runny nose with sneezing and tearing of the eyes. DIAGNOSIS  Your health care provider can help you  determine the allergen or allergens that trigger your symptoms. If you and your health care provider are unable to determine the allergen, skin or blood testing may be used. TREATMENT  Allergic Rhinitis does not have a cure, but it can be controlled by:  Medicines and allergy shots (immunotherapy).  Avoiding the allergen. Hay fever may often be treated with antihistamines in pill or nasal spray forms. Antihistamines block the effects of histamine. There are over-the-counter medicines that may help with nasal congestion and swelling around the eyes. Check with your health care provider before taking or giving this medicine.  If avoiding the allergen or the medicine prescribed do not work, there are many new medicines your health care provider can prescribe. Stronger medicine may be used if initial measures are ineffective. Desensitizing injections can be used if medicine and avoidance does not work. Desensitization is when a patient is given ongoing shots until the body becomes less sensitive to the allergen. Make sure you follow up with your health care provider if problems continue. HOME CARE INSTRUCTIONS It is not possible to completely avoid allergens, but you can reduce your symptoms by taking steps to limit your exposure to them. It helps to know exactly what you are allergic to so that you can avoid your specific triggers. SEEK MEDICAL CARE IF:   You have a fever.  You develop a cough that does not stop easily (persistent).  You have shortness of breath.  You start wheezing.  Symptoms interfere with normal daily activities. Document Released: 06/16/2001 Document Revised: 07/12/2013 Document Reviewed: 05/29/2013 [facility] Patient Information 2014 [place].

## 2014-01-18 NOTE — ED Provider Notes (Signed)
Medical screening examination/treatment/procedure(s) were performed by non-physician practitioner and as supervising physician I was immediately available for consultation/collaboration.  Philipp Deputy, M.D.  Harden Mo, MD 01/18/14 1344

## 2015-08-13 ENCOUNTER — Encounter: Payer: Self-pay | Admitting: Gastroenterology

## 2016-04-22 ENCOUNTER — Encounter: Payer: Self-pay | Admitting: Internal Medicine

## 2016-04-22 ENCOUNTER — Ambulatory Visit (INDEPENDENT_AMBULATORY_CARE_PROVIDER_SITE_OTHER): Payer: Self-pay | Admitting: Internal Medicine

## 2016-04-22 VITALS — BP 178/100 | HR 80 | Temp 98.1°F | Resp 18 | Ht 61.0 in | Wt 250.0 lb

## 2016-04-22 DIAGNOSIS — Z658 Other specified problems related to psychosocial circumstances: Secondary | ICD-10-CM

## 2016-04-22 DIAGNOSIS — R7303 Prediabetes: Secondary | ICD-10-CM

## 2016-04-22 DIAGNOSIS — F439 Reaction to severe stress, unspecified: Secondary | ICD-10-CM

## 2016-04-22 DIAGNOSIS — I10 Essential (primary) hypertension: Secondary | ICD-10-CM

## 2016-04-22 LAB — GLUCOSE, POCT (MANUAL RESULT ENTRY): POC Glucose: 89 mg/dl (ref 70–99)

## 2016-04-22 MED ORDER — LISINOPRIL 10 MG PO TABS: 10.0000 mg | ORAL_TABLET | Freq: Every day | ORAL | Status: DC

## 2016-04-22 NOTE — Patient Instructions (Addendum)
Can google "advance directives, [place]"  And bring up form from Secretary of State. Print and fill out Or can go to "5 wishes"  Which is also in Spanish and fill out--this costs $5--perhaps easier to use. Designate a Medical Power of Attorney to speak for you if you are unable to speak for yourself when ill or injured  Drink a glass of water before every meal Drink 6-8 glasses of water daily Eat three meals daily Eat a protein and healthy fat with every meal (eggs,fish, chicken, turkey and limit red meats) Eat 5 servings of vegetables daily, mix the colors Eat 2 servings of fruit daily with skin, if skin is edible Use smaller plates Put food/utensils down as you chew and swallow each bite Eat at a table with friends/family at least once daily, no TV Do not eat in front of the TV 

## 2016-04-22 NOTE — Progress Notes (Signed)
Subjective:    Patient ID: Terri Powers, female    DOB: 31-Aug-1956, 60 y.o.   MRN: [id number]  HPI  New patient here to establish.  1. Essential Hypertension:  Diagnosed 1 year ago.  Does have family history.  Was not started on medication at the time as she was closer to "borderline"  2.  Prediabetes:  Also diagnosed at physical 1 year ago and started on Metformin twice daily.  She did have one follow up.  Not clear her sugar control was any better.  No history she is aware of of elevated cholesterol Has hDad pneumococcal vaccine Did not get flu vaccine last year. No symptoms of DM complications. Did have eye evaluation last year and no diabetic changes--[facility]  July 2016. No history of cardiac, cerebrovascular or renal disease related.  3.  Seasonal and Environmental Allergies:  Has both pollen and dust allergies.  Allergic to cat dander as well.  Rotating between Zyrtec/Claritin and Rhinocort and Flonase.  Only uses nasal spray during spring and fall.    Current outpatient prescriptions:  .  acetaminophen (TYLENOL) 500 MG tablet, Take 1,000 mg by mouth every 6 (six) hours as needed for mild pain., Disp: , Rfl:  .  albuterol (PROVENTIL HFA;VENTOLIN HFA) 108 (90 BASE) MCG/ACT inhaler, Inhale 1-2 puffs into the lungs every 6 (six) hours as needed for wheezing or shortness of breath., Disp: 1 Inhaler, Rfl: 0 .  budesonide (RHINOCORT ALLERGY) 32 MCG/ACT nasal spray, Place 1 spray into both nostrils daily., Disp: , Rfl:  .  cetirizine (ZYRTEC) 10 MG tablet, Take 10 mg by mouth daily., Disp: , Rfl:  .  fluticasone (FLONASE) 50 MCG/ACT nasal spray, Place 2 sprays into both nostrils daily., Disp: , Rfl:  .  loratadine (CLARITIN) 10 MG tablet, Take 10 mg by mouth daily., Disp: , Rfl:  .  pseudoephedrine (SUDAFED) 30 MG tablet, Take 30 mg by mouth every 4 (four) hours as needed for congestion., Disp: , Rfl:    Allergies  Allergen Reactions  . Peach Flavor Hives  .  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 Plantar fasciitis   . Anemia   . Allergic rhinitis   . Allergy     Environmental   Past Surgical History  Procedure Laterality Date  . Abdominal hysterectomy  11/2008    TAH and BSO for fibroids                  Review of Systems     Objective:   Physical Exam White flat top Obese NAD HEENT:  PERRL, EOMI, TMs pearly gray, throat without injection, nasal mucosa swollen and boggy,  Neck:  Supple, no adenopathy Chest:  CTA CV:  RRR with normal S1 and S2, No S3, S4 or murmur, radial pulses normal and equal.  No LE edema Abd:  S, NT, No HSM or mass, +BS throughout.       Assessment & Plan:  1.  Essential Hypertension:  With prediabetes, start Lisinopril 10 mg daily.  BP check and BMP in 1 week.  Check  CMP today.  2.  Prediabetes: check A1C and FLP today.  Long discussion of diet and physical activity.  If still in prediabetic range, would like information on the Diabetic prevention work group to start soon. Eye referral if in diabetic range with A1C  3.  Allergies:  Encouraged corticosteroid nasal spray daily during the seasonal  allergy time periods and to use one of her antihistamine regularly until it seems less effective, then switch to another, but to avoid switching every other day.  4.  Stress:  Referral to Macie Burows, LCSW/  5.  Tobacco Abuse:  Not ready to quit today.

## 2016-04-23 LAB — COMPREHENSIVE METABOLIC PANEL
ALT: 9 IU/L (ref 0–32)
AST: 11 IU/L (ref 0–40)
Albumin/Globulin Ratio: 1.6 (ref 1.2–2.2)
Albumin: 4.2 g/dL (ref 3.5–5.5)
Alkaline Phosphatase: 92 IU/L (ref 39–117)
BUN/Creatinine Ratio: 14 (ref 9–23)
BUN: 10 mg/dL (ref 6–24)
Bilirubin Total: 0.3 mg/dL (ref 0.0–1.2)
CO2: 22 mmol/L (ref 18–29)
Calcium: 9.1 mg/dL (ref 8.7–10.2)
Chloride: 100 mmol/L (ref 96–106)
Creatinine, Ser: 0.7 mg/dL (ref 0.57–1.00)
GFR calc Af Amer: 111 mL/min/{1.73_m2} (ref >59)
GFR calc non Af Amer: 96 mL/min/{1.73_m2} (ref >59)
Globulin, Total: 2.7 g/dL (ref 1.5–4.5)
Glucose: 83 mg/dL (ref 65–99)
Potassium: 4.2 mmol/L (ref 3.5–5.2)
Sodium: 142 mmol/L (ref 134–144)
Total Protein: 6.9 g/dL (ref 6.0–8.5)

## 2016-04-23 LAB — LIPID PANEL W/O CHOL/HDL RATIO
Cholesterol, Total: 243 mg/dL — ABNORMAL HIGH (ref 100–199)
HDL: 52 mg/dL (ref >39)
LDL Calculated: 179 mg/dL — ABNORMAL HIGH (ref 0–99)
Triglycerides: 61 mg/dL (ref 0–149)
VLDL Cholesterol Cal: 12 mg/dL (ref 5–40)

## 2016-04-23 LAB — HGB A1C W/O EAG: Hgb A1c MFr Bld: 5.8 % — ABNORMAL HIGH (ref 4.8–5.6)

## 2016-04-28 ENCOUNTER — Ambulatory Visit (INDEPENDENT_AMBULATORY_CARE_PROVIDER_SITE_OTHER): Payer: Self-pay | Admitting: Licensed Clinical Social Worker

## 2016-04-28 DIAGNOSIS — F411 Generalized anxiety disorder: Secondary | ICD-10-CM

## 2016-04-29 ENCOUNTER — Ambulatory Visit (INDEPENDENT_AMBULATORY_CARE_PROVIDER_SITE_OTHER): Payer: Self-pay

## 2016-04-29 VITALS — BP 182/90 | HR 84

## 2016-04-29 DIAGNOSIS — I10 Essential (primary) hypertension: Secondary | ICD-10-CM
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
## 2016-04-30 LAB — BASIC METABOLIC PANEL
BUN/Creatinine Ratio: 15 (ref 9–23)
BUN: 12 mg/dL (ref 6–24)
CO2: 25 mmol/L (ref 18–29)
Calcium: 9.3 mg/dL (ref 8.7–10.2)
Chloride: 100 mmol/L (ref 96–106)
Creatinine, Ser: 0.81 mg/dL (ref 0.57–1.00)
GFR calc Af Amer: 93 mL/min/{1.73_m2} (ref >59)
GFR calc non Af Amer: 81 mL/min/{1.73_m2} (ref >59)
Glucose: 134 mg/dL — ABNORMAL HIGH (ref 65–99)
Potassium: 4.2 mmol/L (ref 3.5–5.2)
Sodium: 141 mmol/L (ref 134–144)

## 2016-05-12 ENCOUNTER — Ambulatory Visit (INDEPENDENT_AMBULATORY_CARE_PROVIDER_SITE_OTHER): Payer: Self-pay | Admitting: Licensed Clinical Social Worker

## 2016-05-12 DIAGNOSIS — F411 Generalized anxiety disorder: Secondary | ICD-10-CM

## 2016-05-18 NOTE — Progress Notes (Signed)
   THERAPY PROGRESS NOTE  Session Time: 56min  Participation Level: Active  Behavioral Response: Neat and Well GroomedAlertEuthymic  Type of Therapy: Individual Therapy  Treatment Goals addressed: Coping  Interventions: DBT  Summary: Terri Powers is a 60 y.o. female who presents with a euthymic mood and appropriate affect. She reported that she has been somewhat stressed due to searching for a job but that overall she is feeling better. She engaged in guided imagery activity focused on grief. She reported that she felt more relaxed after the guided imagery. She shared about her own grief process and which stages have been the most difficult for her. She reported that she is still sad many days but feels that she is starting to move into the next stage of acceptance and focusing on herself. She shared about a recent flirtation with a man. She expressed that she did not feel any guilt about it, as she is starting to realize that she is allowed to live her life again. She processed about the worries that she has about getting back into the dating game. Terri Powers and LCSW processed about how she will handle difficult days coming up, including her husband's birthday and the anniversary of his death.   Suicidal/Homicidal: Nowithout intent/plan  Therapist Response: LCSW utilized supportive counseling techniques throughout the session in order to validate emotions and encourage open expression of emotion. LCSW engaged Terri Powers in a guided imagery activity focused on grief, including deep breathing and progressive muscle relaxation. LCSW reminded Terri Powers that the stages of grief are not necessarily chronological, and that she might have hard days on holidays and anniversaries.  Plan: Return again in 2 weeks.  Diagnosis: Axis I: See current hospital problem list    Axis II: No diagnosis    Metta Clines, LCSW 05/18/2016

## 2016-05-28 ENCOUNTER — Other Ambulatory Visit: Payer: Self-pay | Admitting: Licensed Clinical Social Worker

## 2016-06-23 ENCOUNTER — Encounter: Payer: Self-pay | Admitting: Internal Medicine

## 2016-06-23 ENCOUNTER — Ambulatory Visit (INDEPENDENT_AMBULATORY_CARE_PROVIDER_SITE_OTHER): Payer: Self-pay | Admitting: Internal Medicine

## 2016-06-23 VITALS — BP 160/100 | HR 76 | Resp 18 | Ht 61.0 in | Wt 236.0 lb

## 2016-06-23 DIAGNOSIS — N3941 Urge incontinence: Secondary | ICD-10-CM | POA: Insufficient documentation

## 2016-06-23 DIAGNOSIS — R7303 Prediabetes: Secondary | ICD-10-CM | POA: Insufficient documentation

## 2016-06-23 DIAGNOSIS — Z23 Encounter for immunization: Secondary | ICD-10-CM

## 2016-06-23 DIAGNOSIS — J40 Bronchitis, not specified as acute or chronic: Secondary | ICD-10-CM

## 2016-06-23 DIAGNOSIS — I10 Essential (primary) hypertension: Secondary | ICD-10-CM

## 2016-06-23 DIAGNOSIS — J209 Acute bronchitis, unspecified: Secondary | ICD-10-CM

## 2016-06-23 DIAGNOSIS — Z1239 Encounter for other screening for malignant neoplasm of breast: Secondary | ICD-10-CM

## 2016-06-23 DIAGNOSIS — F17200 Nicotine dependence, unspecified, uncomplicated: Secondary | ICD-10-CM

## 2016-06-23 MED ORDER — ALBUTEROL SULFATE (2.5 MG/3ML) 0.083% IN NEBU: 2.5000 mg | INHALATION_SOLUTION | Freq: Once | RESPIRATORY_TRACT | Status: DC

## 2016-06-23 MED ORDER — METOPROLOL TARTRATE 25 MG PO TABS: 25.0000 mg | ORAL_TABLET | Freq: Two times a day (BID) | ORAL | 11 refills | Status: DC

## 2016-06-23 MED ORDER — ALBUTEROL SULFATE HFA 108 (90 BASE) MCG/ACT IN AERS: 2.0000 | INHALATION_SPRAY | Freq: Four times a day (QID) | RESPIRATORY_TRACT | 0 refills | Status: DC | PRN

## 2016-06-23 MED ORDER — AZITHROMYCIN 250 MG PO TABS: ORAL_TABLET | ORAL | 0 refills | Status: DC

## 2016-06-23 MED ORDER — ALBUTEROL SULFATE HFA 108 (90 BASE) MCG/ACT IN AERS: INHALATION_SPRAY | RESPIRATORY_TRACT | 0 refills | Status: DC

## 2016-06-23 NOTE — Patient Instructions (Signed)
Tobacco Cessation:   [id number] or [id number], the former for support and possibly free nicotine patches/gum and support; the latter for [facility] Smoking cessation class. Get rid of all smoking supplies:  Cigarettes, lighters, ashtrays--no stashes just in case at home if you are serious.  Recommend nicotine patches:  21 mg to skin and change daily for 28 days, then down to 14 mg patches for 14 days, then down to 7 mg patches for 14 days, then stop. Get rid of all cigarettes, lighters, ash trays, smoking paraphernalia before start first patch

## 2016-06-23 NOTE — Addendum Note (Signed)
Addended by: Marcelino Duster on: 06/23/2016 05:49 PM   Modules accepted: Orders

## 2016-06-23 NOTE — Progress Notes (Signed)
   Subjective:    Patient ID: Terri Powers, female    DOB: 04/08/1956, 60 y.o.   MRN: [id number]  HPI   1.  Prediabetes:  Has lost 14 lbs since last seen.  Doing lots of physical activity:  Walking/[place] work out. Eating healthier as well.  2.  Hyperlipidemia:  High previously as well.  See lifestyle changes above.  3.  Essential Hypertension:  BP not controlled well.  Dry hacky cough since starting Lisinopril.  4.  Seasonal Allergies:  Symptoms of this are calming down for her so she feels this is not the cause of her cough.  5.  Urinary urge incontinence:  Not so much with coughing, etc.  Just has to go when needs to go.    Current Meds  Medication Sig  . acetaminophen (TYLENOL) 500 MG tablet Take 1,000 mg by mouth every 6 (six) hours as needed for mild pain.  Marland Kitchen albuterol (PROVENTIL HFA;VENTOLIN HFA) 108 (90 BASE) MCG/ACT inhaler Inhale 1-2 puffs into the lungs every 6 (six) hours as needed for wheezing or shortness of breath.  . budesonide (RHINOCORT ALLERGY) 32 MCG/ACT nasal spray Place 1 spray into both nostrils daily.  . cetirizine (ZYRTEC) 10 MG tablet Take 10 mg by mouth daily.  Marland Kitchen lisinopril (PRINIVIL,ZESTRIL) 10 MG tablet Take 1 tablet (10 mg total) by mouth daily.  Marland Kitchen loratadine (CLARITIN) 10 MG tablet Take 10 mg by mouth daily.  . pseudoephedrine (SUDAFED) 30 MG tablet Take 30 mg by mouth every 4 (four) hours as needed for congestion.    Allergies  Allergen Reactions  . Peach Flavor Hives  . Shellfish Allergy     Allergy to seafood        Review of Systems     Objective:   Physical Exam  NAD Recurrent Hacky cough HEENT:  PERRL, EOMI, TMs pearly gray, throat without injection Neck:  Supple, no adenopathy Chest:  Scattered crackles at bases bilaterally and at upper left lung field. CV:  RRR without murmur or rub, radial pulses normal and equal LE:  No edema  Albuterol neb 2.5 mg given with resolution of crackles        Assessment &  Plan:  1.  Acute bronchitis with bronchospasm:  Zpak plus albuterol HFA 2 puffs as needed.  2.  Cough:  May also have element of ACE I cough:  Stop Lisinopril and start Metoprolol 25 mg twice daily.  BP check in 1 week.  3.  Prediabetes:  Significant weight loss with lifestyle changes.  Check A1C in 2 months.  4.  Hyperlipidemia:  FLP in 2 months, follow up 1 week later with me to discuss  5.  Essential Hypertension:  Stop Lisinopril as bp not well controlled and likely some element of ACE I cough.  Start Metoprolol 25 mg twice daily.  Nurse bp check in 1 week.  6.  Tobacco Abuse:  Nicotine patch 21 mg to start for 28 days, then 14 mg for 14 days, then 7 mg for 14 days, then stop.

## 2016-06-30 ENCOUNTER — Ambulatory Visit (INDEPENDENT_AMBULATORY_CARE_PROVIDER_SITE_OTHER): Payer: Self-pay

## 2016-06-30 ENCOUNTER — Other Ambulatory Visit: Payer: Self-pay | Admitting: Obstetrics and Gynecology

## 2016-06-30 VITALS — BP 142/90 | HR 56

## 2016-06-30 DIAGNOSIS — I10 Essential (primary) hypertension: Secondary | ICD-10-CM

## 2016-06-30 DIAGNOSIS — Z1231 Encounter for screening mammogram for malignant neoplasm of breast: Secondary | ICD-10-CM

## 2016-06-30 NOTE — Progress Notes (Signed)
Patient in for a check of BP and pulse after medication for blood pressure changed.

## 2016-07-16 ENCOUNTER — Ambulatory Visit (HOSPITAL_COMMUNITY)
Admission: RE | Admit: 2016-07-16 | Discharge: 2016-07-16 | Disposition: A | Discharge status: Home / Self Care | Payer: Self-pay | Source: Ambulatory Visit | Attending: Obstetrics and Gynecology | Admitting: Obstetrics and Gynecology

## 2016-07-16 ENCOUNTER — Ambulatory Visit
Admission: RE | Admit: 2016-07-16 | Discharge: 2016-07-16 | Disposition: A | Discharge status: Home / Self Care | Payer: No Typology Code available for payment source | Source: Ambulatory Visit | Attending: Obstetrics and Gynecology | Admitting: Obstetrics and Gynecology

## 2016-07-16 ENCOUNTER — Encounter (HOSPITAL_COMMUNITY): Payer: Self-pay

## 2016-07-16 ENCOUNTER — Ambulatory Visit (HOSPITAL_COMMUNITY): Payer: Self-pay

## 2016-07-16 VITALS — BP 140/82 | Temp 98.8°F | Ht 62.0 in | Wt 235.4 lb

## 2016-07-16 DIAGNOSIS — Z1239 Encounter for other screening for malignant neoplasm of breast: Secondary | ICD-10-CM

## 2016-07-16 DIAGNOSIS — Z1231 Encounter for screening mammogram for malignant neoplasm of breast: Secondary | ICD-10-CM

## 2016-07-16 HISTORY — DX: Essential (primary) hypertension: I10

## 2016-07-16 NOTE — Progress Notes (Signed)
No complaints today.   Pap Smear: Pap smear not completed today. Last Pap smear was 08/22/2008 at [facility] and normal. Per patient has no history of an abnormal Pap smear. Patient has a history of a hysterectomy 12/14/2008 due to fibroids. Pap smears are not recommended due to her history of a hysterectomy for benign reasons per ACOG and BCCCP guidelines. Last Pap smear result is in EPIC.  Physical exam: Breasts Breasts symmetrical. No skin abnormalities bilateral breasts. No nipple retraction bilateral breasts. No nipple discharge bilateral breasts. No lymphadenopathy. No lumps palpated bilateral breasts. No complaints of pain or tenderness on exam. Referred patient to the [facility] for a screening mammogram. Appointment scheduled for Thursday, July 16, 2016 at 1130.        Pelvic/Bimanual No Pap smear completed today since patient has history of a hysterectomy for benign reasons. Pap smear not indicated per BCCCP guidelines.   Smoking History: Patient is a current smoker. Discussed smoking cessation with patient. Referred patient to the [facility] Quitline and gave resources to free smoking cessation classes offered at [facility].  Patient Navigation: Patient education provided. Access to services provided for patient through [place] program.   Colorectal Cancer Screening: Patient had a colonoscopy completed in 2010. No complaints today.

## 2016-07-16 NOTE — Patient Instructions (Signed)
Explained breast self awareness to Terilynn L Galloway-Bent. Patient did not need a Pap smear today due to her history of a hysterectomy for benign reasons. Informed patient that she doesn't need any further Pap smears due to her history of a hysterectomy for benign reasons. [facility] for [facility]. Appointment scheduled for Thursday, July 16, 2016 at 1130. Let patient know the [facility] will follow up with her within the next couple weeks with results of mammogram by letter or phone. Discussed smoking cessation with patient. Referred patient to the [facility] Quitline and gave resources to free smoking cessation classes offered at [facility]. Kaycie L Galloway-[place]selakai Dezza verbalized understanding.  [place], Arvil Chaco, RN 12:50 PM

## 2016-07-20 ENCOUNTER — Encounter (HOSPITAL_COMMUNITY): Payer: Self-pay | Admitting: *Deleted

## 2016-08-24 ENCOUNTER — Other Ambulatory Visit (INDEPENDENT_AMBULATORY_CARE_PROVIDER_SITE_OTHER): Payer: Self-pay

## 2016-08-24 VITALS — BP 124/72

## 2016-08-24 DIAGNOSIS — E785 Hyperlipidemia, unspecified: Secondary | ICD-10-CM

## 2016-08-24 DIAGNOSIS — R7303 Prediabetes: Secondary | ICD-10-CM

## 2016-08-25 LAB — LIPID PANEL
Chol/HDL Ratio: 3.6 ratio units (ref 0.0–4.4)
Cholesterol, Total: 212 mg/dL — ABNORMAL HIGH (ref 100–199)
HDL: 59 mg/dL (ref >39)
LDL Calculated: 142 mg/dL — ABNORMAL HIGH (ref 0–99)
Triglycerides: 56 mg/dL (ref 0–149)
VLDL Cholesterol Cal: 11 mg/dL (ref 5–40)

## 2016-08-25 LAB — HEMOGLOBIN A1C
Est. average glucose Bld gHb Est-mCnc: 108 mg/dL
Hgb A1c MFr Bld: 5.4 % (ref 4.8–5.6)

## 2016-08-26 NOTE — Progress Notes (Signed)
Discussed labs with patient

## 2016-08-31 ENCOUNTER — Encounter: Payer: Self-pay | Admitting: Internal Medicine

## 2016-08-31 ENCOUNTER — Ambulatory Visit (INDEPENDENT_AMBULATORY_CARE_PROVIDER_SITE_OTHER): Payer: Self-pay | Admitting: Internal Medicine

## 2016-08-31 VITALS — BP 142/90 | HR 78 | Resp 14 | Ht 60.5 in | Wt 225.0 lb

## 2016-08-31 DIAGNOSIS — L309 Dermatitis, unspecified: Secondary | ICD-10-CM

## 2016-08-31 DIAGNOSIS — Z23 Encounter for immunization: Secondary | ICD-10-CM

## 2016-08-31 DIAGNOSIS — I10 Essential (primary) hypertension: Secondary | ICD-10-CM

## 2016-08-31 DIAGNOSIS — R7303 Prediabetes: Secondary | ICD-10-CM

## 2016-08-31 DIAGNOSIS — J3089 Other allergic rhinitis: Secondary | ICD-10-CM

## 2016-08-31 MED ORDER — MOMETASONE FUROATE 50 MCG/ACT NA SUSP: NASAL | 12 refills | Status: DC

## 2016-08-31 MED ORDER — METOPROLOL TARTRATE 25 MG PO TABS: ORAL_TABLET | ORAL | 11 refills | Status: DC

## 2016-08-31 MED ORDER — ALBUTEROL SULFATE HFA 108 (90 BASE) MCG/ACT IN AERS: INHALATION_SPRAY | RESPIRATORY_TRACT | 0 refills | Status: DC

## 2016-08-31 MED ORDER — MONTELUKAST SODIUM 10 MG PO TABS: 10.0000 mg | ORAL_TABLET | Freq: Every day | ORAL | 11 refills | Status: DC

## 2016-08-31 MED ORDER — TRIAMCINOLONE ACETONIDE 0.1 % EX OINT: 1.0000 "application " | TOPICAL_OINTMENT | Freq: Two times a day (BID) | CUTANEOUS | 1 refills | Status: DC

## 2016-08-31 NOTE — Progress Notes (Signed)
Subjective:    Patient ID: Terri Powers, female    DOB: October 02, 1956, 60 y.o.   MRN: [id number]  HPI   1.  Prediabetes:  A1C recently 5.4% in normal range and has lost a total of 34 lbs.    2.  Hypercholesterolemia:  Has also made significant improvement.  Lipid Panel     Component Value Date/Time   CHOL 212 (H) 08/24/2016 0904   TRIG 56 08/24/2016 0904   HDL 59 08/24/2016 0904   CHOLHDL 3.6 08/24/2016 0904   LDLCALC 142 (H) 08/24/2016 0904    3.  Allergies:  Allergic to everything:  Dust, mold, grasses, tree pollen.  Tested at [facility] several years ago.   No pets Wall to wall carpeting.   Lots of pillows on bed Vacuums maybe once weekly. Also with eczema this time of year--bilateral lateral calf area, tops of toes, feet and medial ankle. Uses Dove soap with shea butter, then coconut oil as well as Lubriderm following. Has not used cortisone cream on her patches in some time.    4.  Essential Hypertension:  In a hurry to get here today.  Is also taking Sudafed on and off.  Took last week.    Current Meds  Medication Sig  . acetaminophen (TYLENOL) 500 MG tablet Take 1,000 mg by mouth every 6 (six) hours as needed for mild pain.  Marland Kitchen albuterol (PROVENTIL HFA;VENTOLIN HFA) 108 (90 Base) MCG/ACT inhaler 2 puffs every 4 hours as needed for shortness of breath  . budesonide (RHINOCORT ALLERGY) 32 MCG/ACT nasal spray Place 1 spray into both nostrils daily.  . cetirizine (ZYRTEC) 10 MG tablet Take 10 mg by mouth daily.  . fluticasone (FLONASE) 50 MCG/ACT nasal spray Place 2 sprays into both nostrils daily.  Marland Kitchen loratadine (CLARITIN) 10 MG tablet Take 10 mg by mouth daily.  . metoprolol tartrate (LOPRESSOR) 25 MG tablet Take 1 tablet (25 mg total) by mouth 2 (two) times daily.  . pseudoephedrine (SUDAFED) 30 MG tablet Take 30 mg by mouth every 4 (four) hours as needed for congestion.   Current Facility-Administered Medications for the 08/31/16 encounter (Office Visit) with  Mack Hook, MD  Medication  . albuterol (PROVENTIL) (2.5 MG/3ML) 0.083% nebulizer solution 2.5 mg   Allergies  Allergen Reactions  . Lisinopril Cough    Side effect  . Peach Flavor Hives  . Shellfish Allergy     Allergy to seafood    Review of Systems     Objective:   Physical Exam Congested, NAD HEENT: PERRL, EOMI, no injection.  TMs pearly gray, throat with mild cobbling.  Nasal mucosa swollen with clear discharge. Neck:  Supple, no adenopathy Chest:  CTA CV:  RRR without murmur or rub, radial pulses normal and equal Skin:  Patch of thickened, fissured, lichenified skin, lateral high ankle/calf of right leg.       Assessment & Plan:  1.  Prediabetes:  Fabulous job getting this under control with lifestyle changes.  Now in normal range with A1C  2.  Hyperlipidemia:  As in 1.  Still not at goal, but fast approaching.  3.  Essential Hypertension:  Up today, but using decongestant and rushing to get here. Stop decongestant Nurse visit for bp check in 2 weeks.  4.  Environmental and seasonal allergies:   Add Nasonex, Montelukast, Consider Allegra as Zyrtec and Claritin have not seemed to control recently. Hypallergenic mattress and pillow covers.  No extra pillows on bed, vacuum twice weekly and wash  bedclothes weekly

## 2016-09-02 ENCOUNTER — Telehealth: Payer: Self-pay | Admitting: Internal Medicine

## 2016-09-02 NOTE — Telephone Encounter (Signed)
To Dr. Mulberry for approval 

## 2016-09-02 NOTE — Telephone Encounter (Signed)
Patient states she went to the [facility] pharmacy to apply for the MAP program and the dates and times they are giving her she is scheduled to be at work. Pt. Just started working and does not want to be out of work and would like for the Rx to be sent as generic to [facility] on [address]  mometasone (NASONEX) 50 MCG/ACT nasal spray albuterol (PROVENTIL HFA;VENTOLIN HFA) 108 (90 Base) MCG/ACT inhaler  cetirizine (ZYRTEC) 10 MG tablet

## 2016-09-14 ENCOUNTER — Ambulatory Visit (INDEPENDENT_AMBULATORY_CARE_PROVIDER_SITE_OTHER): Payer: Self-pay | Admitting: Internal Medicine

## 2016-09-14 VITALS — BP 150/82 | HR 70 | Resp 12

## 2016-09-14 DIAGNOSIS — I10 Essential (primary) hypertension: Secondary | ICD-10-CM

## 2016-09-14 DIAGNOSIS — J3089 Other allergic rhinitis: Secondary | ICD-10-CM | POA: Insufficient documentation

## 2016-09-14 MED ORDER — FLUTICASONE PROPIONATE 50 MCG/ACT NA SUSP: 2.0000 | Freq: Every day | NASAL | 11 refills | Status: DC

## 2016-09-14 MED ORDER — CETIRIZINE HCL 10 MG PO TABS: 10.0000 mg | ORAL_TABLET | Freq: Every day | ORAL | 11 refills | Status: DC

## 2016-09-14 MED ORDER — METOPROLOL TARTRATE 50 MG PO TABS: ORAL_TABLET | ORAL | 3 refills | Status: DC

## 2016-09-14 NOTE — Telephone Encounter (Signed)
Discussed at nurse visit.  She is unable to get to MAP meeting times. Medications sent elsewhere and changed for affordability

## 2016-09-14 NOTE — Progress Notes (Signed)
Here for bp and pulse check.   Smoked death stick just before coming in. SBP higher than previous Tolerating 50 mg dose of Metoprolol in the evening and 25 mg in the morning. Unable to apply for MAP due to work schedule. Good Rx has coupon for Fluticasone nasal at [facility] for about $10--sent there and removed Nasonex. Cetirizine at [facility]

## 2016-09-16 ENCOUNTER — Encounter: Payer: Self-pay | Admitting: Internal Medicine

## 2016-09-21 ENCOUNTER — Ambulatory Visit: Payer: Self-pay

## 2016-09-21 VITALS — BP 138/78 | HR 62

## 2016-09-21 DIAGNOSIS — I10 Essential (primary) hypertension: Secondary | ICD-10-CM

## 2016-10-11 DIAGNOSIS — L309 Dermatitis, unspecified: Secondary | ICD-10-CM | POA: Insufficient documentation

## 2017-01-25 ENCOUNTER — Encounter: Payer: Self-pay | Admitting: Internal Medicine

## 2017-09-15 ENCOUNTER — Other Ambulatory Visit: Payer: Self-pay | Admitting: Internal Medicine

## 2017-09-29 ENCOUNTER — Emergency Department (HOSPITAL_COMMUNITY)
Admission: EM | Admit: 2017-09-29 | Discharge: 2017-09-29 | Disposition: A | Discharge status: Home / Self Care | Payer: No Typology Code available for payment source | Attending: Emergency Medicine | Admitting: Emergency Medicine

## 2017-09-29 ENCOUNTER — Encounter (HOSPITAL_COMMUNITY): Payer: Self-pay

## 2017-09-29 DIAGNOSIS — M62838 Other muscle spasm: Secondary | ICD-10-CM | POA: Diagnosis not present

## 2017-09-29 DIAGNOSIS — I10 Essential (primary) hypertension: Secondary | ICD-10-CM | POA: Insufficient documentation

## 2017-09-29 DIAGNOSIS — M25511 Pain in right shoulder: Secondary | ICD-10-CM | POA: Diagnosis not present

## 2017-09-29 DIAGNOSIS — R51 Headache: Secondary | ICD-10-CM | POA: Diagnosis not present

## 2017-09-29 DIAGNOSIS — M25512 Pain in left shoulder: Secondary | ICD-10-CM | POA: Insufficient documentation

## 2017-09-29 DIAGNOSIS — Y999 Unspecified external cause status: Secondary | ICD-10-CM | POA: Diagnosis not present

## 2017-09-29 DIAGNOSIS — M25551 Pain in right hip: Secondary | ICD-10-CM

## 2017-09-29 MED ORDER — CYCLOBENZAPRINE HCL 10 MG PO TABS: 10.0000 mg | ORAL_TABLET | Freq: Two times a day (BID) | ORAL | 0 refills | Status: DC | PRN
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
## 2017-09-29 NOTE — Discharge Instructions (Signed)
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
## 2017-09-29 NOTE — ED Notes (Signed)
Pt verbalizes understanding of d/c instructions. Pt received prescriptions. Pt ambulatory at d/c with all belongings.  

## 2017-10-14 ENCOUNTER — Encounter (HOSPITAL_COMMUNITY): Payer: Self-pay

## 2017-10-14 ENCOUNTER — Other Ambulatory Visit: Payer: Self-pay

## 2017-10-14 ENCOUNTER — Ambulatory Visit (HOSPITAL_COMMUNITY)
Admission: EM | Admit: 2017-10-14 | Discharge: 2017-10-14 | Disposition: A | Discharge status: Home / Self Care | Payer: No Typology Code available for payment source | Attending: Family Medicine | Admitting: Family Medicine

## 2017-10-14 DIAGNOSIS — M25551 Pain in right hip: Secondary | ICD-10-CM

## 2017-10-14 DIAGNOSIS — M545 Low back pain, unspecified: Secondary | ICD-10-CM

## 2017-10-14 DIAGNOSIS — M25511 Pain in right shoulder: Secondary | ICD-10-CM

## 2017-10-14 MED ORDER — KETOROLAC TROMETHAMINE 30 MG/ML IJ SOLN
INTRAMUSCULAR | Status: AC
  Filled 2017-10-14: qty 1

## 2017-10-14 MED ORDER — KETOROLAC TROMETHAMINE 30 MG/ML IJ SOLN
30.0000 mg | Freq: Once | INTRAMUSCULAR | Status: AC
  Administered 2017-10-14: 30 mg via INTRAMUSCULAR

## 2017-10-14 MED ORDER — PREDNISONE 10 MG PO TABS: ORAL_TABLET | ORAL | 0 refills | Status: AC

## 2017-10-14 NOTE — Discharge Instructions (Signed)
Ice to shoulder at end of day. Heat may be helpful for low back pain.  May start prednisone course tomorrow, see instructions. Light regular activity, see exercises provided. Continue to follow with orthopedics as previously scheduled for further evaluation.
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
## 2017-11-02 ENCOUNTER — Ambulatory Visit: Payer: BLUE CROSS/BLUE SHIELD | Admitting: Internal Medicine

## 2017-11-02 ENCOUNTER — Encounter: Payer: Self-pay | Admitting: Internal Medicine

## 2017-11-02 VITALS — BP 170/100 | HR 64 | Resp 12 | Ht 60.5 in | Wt 243.0 lb

## 2017-11-02 DIAGNOSIS — L309 Dermatitis, unspecified: Secondary | ICD-10-CM | POA: Diagnosis not present

## 2017-11-02 DIAGNOSIS — E7800 Pure hypercholesterolemia, unspecified: Secondary | ICD-10-CM | POA: Diagnosis not present

## 2017-11-02 DIAGNOSIS — J3089 Other allergic rhinitis: Secondary | ICD-10-CM | POA: Diagnosis not present

## 2017-11-02 DIAGNOSIS — M25511 Pain in right shoulder: Secondary | ICD-10-CM

## 2017-11-02 DIAGNOSIS — I10 Essential (primary) hypertension: Secondary | ICD-10-CM | POA: Diagnosis not present

## 2017-11-02 DIAGNOSIS — Z6841 Body Mass Index (BMI) 40.0 and over, adult: Secondary | ICD-10-CM

## 2017-11-02 DIAGNOSIS — Z79899 Other long term (current) drug therapy: Secondary | ICD-10-CM

## 2017-11-02 DIAGNOSIS — D509 Iron deficiency anemia, unspecified: Secondary | ICD-10-CM

## 2017-11-02 DIAGNOSIS — M79651 Pain in right thigh: Secondary | ICD-10-CM

## 2017-11-02 DIAGNOSIS — M545 Low back pain: Secondary | ICD-10-CM

## 2017-11-02 DIAGNOSIS — R7303 Prediabetes: Secondary | ICD-10-CM

## 2017-11-02 MED ORDER — METOPROLOL TARTRATE 50 MG PO TABS: ORAL_TABLET | ORAL | 3 refills | Status: DC

## 2017-11-02 MED ORDER — CLOBETASOL PROPIONATE 0.05 % EX CREA: TOPICAL_CREAM | CUTANEOUS | 4 refills | Status: DC

## 2017-11-02 MED ORDER — FLUTICASONE PROPIONATE 50 MCG/ACT NA SUSP: 2.0000 | Freq: Every day | NASAL | 11 refills | Status: DC

## 2017-11-02 MED ORDER — MONTELUKAST SODIUM 10 MG PO TABS: 10.0000 mg | ORAL_TABLET | Freq: Every day | ORAL | 11 refills | Status: DC

## 2017-11-02 MED ORDER — ALBUTEROL SULFATE HFA 108 (90 BASE) MCG/ACT IN AERS: INHALATION_SPRAY | RESPIRATORY_TRACT | 1 refills | Status: DC

## 2017-11-02 MED ORDER — AMLODIPINE BESYLATE 5 MG PO TABS: 5.0000 mg | ORAL_TABLET | Freq: Every day | ORAL | 11 refills | Status: DC
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
## 2017-11-03 LAB — COMPREHENSIVE METABOLIC PANEL
ALT: 13 IU/L (ref 0–32)
AST: 11 IU/L (ref 0–40)
Albumin/Globulin Ratio: 1.6 (ref 1.2–2.2)
Albumin: 4.1 g/dL (ref 3.5–5.5)
Alkaline Phosphatase: 85 IU/L (ref 39–117)
BUN/Creatinine Ratio: 10 (ref 9–23)
BUN: 8 mg/dL (ref 6–24)
Bilirubin Total: 0.4 mg/dL (ref 0.0–1.2)
CO2: 25 mmol/L (ref 20–29)
Calcium: 9 mg/dL (ref 8.7–10.2)
Chloride: 106 mmol/L (ref 96–106)
Creatinine, Ser: 0.78 mg/dL (ref 0.57–1.00)
GFR calc Af Amer: 96 mL/min/{1.73_m2} (ref >59)
GFR calc non Af Amer: 83 mL/min/{1.73_m2} (ref >59)
Globulin, Total: 2.5 g/dL (ref 1.5–4.5)
Glucose: 100 mg/dL — ABNORMAL HIGH (ref 65–99)
Potassium: 5 mmol/L (ref 3.5–5.2)
Sodium: 144 mmol/L (ref 134–144)
Total Protein: 6.6 g/dL (ref 6.0–8.5)

## 2017-11-03 LAB — LIPID PANEL W/O CHOL/HDL RATIO
Cholesterol, Total: 250 mg/dL — ABNORMAL HIGH (ref 100–199)
HDL: 58 mg/dL (ref >39)
LDL Calculated: 181 mg/dL — ABNORMAL HIGH (ref 0–99)
Triglycerides: 55 mg/dL (ref 0–149)
VLDL Cholesterol Cal: 11 mg/dL (ref 5–40)

## 2017-11-03 LAB — CBC WITH DIFFERENTIAL/PLATELET
Basophils Absolute: 0 10*3/uL (ref 0.0–0.2)
Basos: 0 %
EOS (ABSOLUTE): 0.3 10*3/uL (ref 0.0–0.4)
Eos: 5 %
Hematocrit: 40.6 % (ref 34.0–46.6)
Hemoglobin: 13.7 g/dL (ref 11.1–15.9)
Immature Grans (Abs): 0 10*3/uL (ref 0.0–0.1)
Immature Granulocytes: 0 %
Lymphocytes Absolute: 2.5 10*3/uL (ref 0.7–3.1)
Lymphs: 48 %
MCH: 30.9 pg (ref 26.6–33.0)
MCHC: 33.7 g/dL (ref 31.5–35.7)
MCV: 92 fL (ref 79–97)
Monocytes Absolute: 0.5 10*3/uL (ref 0.1–0.9)
Monocytes: 9 %
Neutrophils Absolute: 2 10*3/uL (ref 1.4–7.0)
Neutrophils: 38 %
Platelets: 273 10*3/uL (ref 150–379)
RBC: 4.43 x10E6/uL (ref 3.77–5.28)
RDW: 14.9 % (ref 12.3–15.4)
WBC: 5.3 10*3/uL (ref 3.4–10.8)

## 2017-11-03 LAB — HGB A1C W/O EAG: Hgb A1c MFr Bld: 6 % — ABNORMAL HIGH (ref 4.8–5.6)

## 2017-11-04 NOTE — Progress Notes (Signed)
Facility has contacted patient to schedule appointment for physical therapy

## 2017-11-10 ENCOUNTER — Ambulatory Visit (INDEPENDENT_AMBULATORY_CARE_PROVIDER_SITE_OTHER): Payer: BLUE CROSS/BLUE SHIELD

## 2017-11-10 VITALS — BP 138/80 | HR 76

## 2017-11-10 DIAGNOSIS — I10 Essential (primary) hypertension: Secondary | ICD-10-CM

## 2017-11-10 MED ORDER — CLOBETASOL PROP EMOLLIENT BASE 0.05 % EX CREA: TOPICAL_CREAM | CUTANEOUS | 4 refills | Status: DC

## 2017-11-10 NOTE — Progress Notes (Signed)
Patient BP in normal range informed to continue with current dose of medication. Patient verbalized understanding.

## 2018-02-02 ENCOUNTER — Encounter: Payer: Self-pay | Admitting: Internal Medicine

## 2018-02-02 ENCOUNTER — Ambulatory Visit: Payer: BLUE CROSS/BLUE SHIELD | Admitting: Internal Medicine

## 2018-02-02 VITALS — BP 138/78 | HR 74 | Resp 12 | Ht 60.5 in | Wt 248.0 lb

## 2018-02-02 DIAGNOSIS — R7303 Prediabetes: Secondary | ICD-10-CM

## 2018-02-02 DIAGNOSIS — L309 Dermatitis, unspecified: Secondary | ICD-10-CM | POA: Diagnosis not present

## 2018-02-02 DIAGNOSIS — J3089 Other allergic rhinitis: Secondary | ICD-10-CM | POA: Diagnosis not present

## 2018-02-02 DIAGNOSIS — I10 Essential (primary) hypertension: Secondary | ICD-10-CM

## 2018-02-02 DIAGNOSIS — E7800 Pure hypercholesterolemia, unspecified: Secondary | ICD-10-CM

## 2018-02-02 DIAGNOSIS — Z6841 Body Mass Index (BMI) 40.0 and over, adult: Secondary | ICD-10-CM

## 2018-02-02 DIAGNOSIS — R252 Cramp and spasm: Secondary | ICD-10-CM

## 2018-02-02 MED ORDER — CLOBETASOL PROPIONATE 0.05 % EX OINT: 1.0000 "application " | TOPICAL_OINTMENT | Freq: Two times a day (BID) | CUTANEOUS | 11 refills | Status: DC

## 2018-02-02 NOTE — Patient Instructions (Signed)
Drink a glass of water before every meal Drink 6-8 glasses of water daily Eat three meals daily Eat a protein and healthy fat with every meal (eggs,fish, chicken, [place] and limit red meats) Eat 5 servings of vegetables daily, mix the colors Eat 2 servings of fruit daily with skin, if skin is edible Use smaller plates Put food/utensils down as you chew and swallow each bite Eat at a table with friends/family at least once daily, no TV Do not eat in front of the TV  Recent studies show that people who consume all of their calories in a 12 hour period lose weight more efficiently.  For example, if you eat your first meal at 7:00 a.m., your last meal of the day should be completed by 7:00 p.m.  First goal is stopping the sweet drinks--goal to start is 14 days.  Write it on calendar. Next is for physical activity start
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
## 2018-02-03 ENCOUNTER — Other Ambulatory Visit (INDEPENDENT_AMBULATORY_CARE_PROVIDER_SITE_OTHER): Payer: BLUE CROSS/BLUE SHIELD

## 2018-02-03 DIAGNOSIS — E7800 Pure hypercholesterolemia, unspecified: Secondary | ICD-10-CM

## 2018-02-03 DIAGNOSIS — Z79899 Other long term (current) drug therapy: Secondary | ICD-10-CM

## 2018-02-03 DIAGNOSIS — R7303 Prediabetes: Secondary | ICD-10-CM | POA: Diagnosis not present

## 2018-02-04 LAB — COMPREHENSIVE METABOLIC PANEL
ALT: 10 IU/L (ref 0–32)
AST: 16 IU/L (ref 0–40)
Albumin/Globulin Ratio: 1.5 (ref 1.2–2.2)
Albumin: 4 g/dL (ref 3.5–5.5)
Alkaline Phosphatase: 89 IU/L (ref 39–117)
BUN/Creatinine Ratio: 13 (ref 9–23)
BUN: 10 mg/dL (ref 6–24)
Bilirubin Total: 0.3 mg/dL (ref 0.0–1.2)
CO2: 27 mmol/L (ref 20–29)
Calcium: 8.9 mg/dL (ref 8.7–10.2)
Chloride: 104 mmol/L (ref 96–106)
Creatinine, Ser: 0.77 mg/dL (ref 0.57–1.00)
GFR calc Af Amer: 98 mL/min/{1.73_m2} (ref >59)
GFR calc non Af Amer: 85 mL/min/{1.73_m2} (ref >59)
Globulin, Total: 2.7 g/dL (ref 1.5–4.5)
Glucose: 110 mg/dL — ABNORMAL HIGH (ref 65–99)
Potassium: 4 mmol/L (ref 3.5–5.2)
Sodium: 144 mmol/L (ref 134–144)
Total Protein: 6.7 g/dL (ref 6.0–8.5)

## 2018-02-04 LAB — HGB A1C W/O EAG: Hgb A1c MFr Bld: 5.8 % — ABNORMAL HIGH (ref 4.8–5.6)

## 2018-02-04 LAB — LIPID PANEL W/O CHOL/HDL RATIO
Cholesterol, Total: 249 mg/dL — ABNORMAL HIGH (ref 100–199)
HDL: 60 mg/dL (ref >39)
LDL Calculated: 178 mg/dL — ABNORMAL HIGH (ref 0–99)
Triglycerides: 56 mg/dL (ref 0–149)
VLDL Cholesterol Cal: 11 mg/dL (ref 5–40)

## 2018-05-11 ENCOUNTER — Ambulatory Visit: Payer: BLUE CROSS/BLUE SHIELD | Admitting: Internal Medicine

## 2018-05-11 ENCOUNTER — Encounter: Payer: Self-pay | Admitting: Internal Medicine

## 2018-05-11 VITALS — BP 140/78 | HR 78 | Resp 12 | Ht 60.5 in | Wt 252.0 lb

## 2018-05-11 DIAGNOSIS — Z6841 Body Mass Index (BMI) 40.0 and over, adult: Secondary | ICD-10-CM

## 2018-05-11 DIAGNOSIS — E7800 Pure hypercholesterolemia, unspecified: Secondary | ICD-10-CM

## 2018-05-11 DIAGNOSIS — L309 Dermatitis, unspecified: Secondary | ICD-10-CM | POA: Diagnosis not present

## 2018-05-11 DIAGNOSIS — R7303 Prediabetes: Secondary | ICD-10-CM

## 2018-05-11 DIAGNOSIS — I10 Essential (primary) hypertension: Secondary | ICD-10-CM | POA: Diagnosis not present

## 2018-05-11 DIAGNOSIS — Z1239 Encounter for other screening for malignant neoplasm of breast: Secondary | ICD-10-CM

## 2018-05-11 DIAGNOSIS — Z1231 Encounter for screening mammogram for malignant neoplasm of breast: Secondary | ICD-10-CM

## 2018-05-11 DIAGNOSIS — J3089 Other allergic rhinitis: Secondary | ICD-10-CM

## 2018-05-11 MED ORDER — BETAMETHASONE VALERATE 0.1 % EX OINT: TOPICAL_OINTMENT | CUTANEOUS | 4 refills | Status: DC

## 2018-05-11 MED ORDER — ALBUTEROL SULFATE HFA 108 (90 BASE) MCG/ACT IN AERS: INHALATION_SPRAY | RESPIRATORY_TRACT | 1 refills | Status: DC

## 2018-05-11 MED ORDER — MONTELUKAST SODIUM 10 MG PO TABS: 10.0000 mg | ORAL_TABLET | Freq: Every day | ORAL | 3 refills | Status: DC

## 2018-05-11 MED ORDER — AMLODIPINE BESYLATE 5 MG PO TABS: 5.0000 mg | ORAL_TABLET | Freq: Every day | ORAL | 3 refills | Status: DC

## 2018-05-11 MED ORDER — ALBUTEROL SULFATE HFA 108 (90 BASE) MCG/ACT IN AERS: INHALATION_SPRAY | RESPIRATORY_TRACT | 2 refills | Status: DC

## 2018-05-11 NOTE — Patient Instructions (Signed)
Small regular goals.  [place] [facility] Talk to [employer] after dinner African Dance Look up healthier alternatives to comfort food.

## 2018-05-11 NOTE — Progress Notes (Signed)
   Subjective:    Patient ID: Terri Powers, female    DOB: [birth year], 62 y.o.   MRN: [id number]  HPI   1.  Obesity/Prediabetes:  Realizes she needs help losing weight.  A1C was down to 5.8% in May  2.  Essential Hypertension: was hurrying to get here today.  3.  Eczema:  Dove Shea butter.  Sometimes Nivea or Eucerin once daily.    4.  Hyperlipidemia:  Elevated without much change from May, but has not been able to make lifestyle changes since last results.  Is motivated now.    Current Meds  Medication Sig  . acetaminophen (TYLENOL) 500 MG tablet Take 1,000 mg by mouth every 6 (six) hours as needed for mild pain.  Marland Kitchen albuterol (PROVENTIL HFA;VENTOLIN HFA) 108 (90 Base) MCG/ACT inhaler 2 puffs every 4 hours as needed for shortness of breath  . amLODipine (NORVASC) 5 MG tablet Take 1 tablet (5 mg total) by mouth daily.  . cetirizine (ZYRTEC) 10 MG tablet Take 1 tablet (10 mg total) by mouth daily.  . clobetasol ointment (TEMOVATE) 2.48 % Apply 1 application topically 2 (two) times daily.  . fluticasone (FLONASE) 50 MCG/ACT nasal spray Place 2 sprays into both nostrils daily.  . metoprolol tartrate (LOPRESSOR) 50 MG tablet 1 tab twice daily  . montelukast (SINGULAIR) 10 MG tablet Take 1 tablet (10 mg total) by mouth at bedtime.  . Multiple Vitamin (MULTIVITAMIN) tablet Take 1 tablet by mouth daily.    Allergies  Allergen Reactions  . Lisinopril Cough    Side effect  . Peach Flavor Hives  . Shellfish Allergy     Allergy to seafood    Review of Systems     Objective:   Physical Exam NAD HEENT:  PERRL, EOMI,  Neck:  Supple No adenopathy Chest:  CTA CV:  RRR with normal S1 and S2, No S3, S4 or murmur.  Radial and DP pulses normal and equal Abd:  S, NT, No HSM or mass, + BS LE:  No edema. Skin:  Patches of flaking dry skin with mild underlying erythema.        Assessment & Plan:  1.  Obesity/prediabetes:  To start making regular small goals and get back to  eating healthy and physical activity.  Consider Nutrition referral.  2.  Hypertension:  Add Amlodipine 5 mg daily.  BP check in 1 month  3.  Eczema:  Betamethasone ointment twice daily toe affected areas as needed.    4.  Allergies:  Refilled Montelukast.    5.  Hypercholesterolemia:  Motivated now to work on lifestyle changes.  6.  HM:  Mammogram scheduled.  Encouraged influenza vaccine during one of our clinics.

## 2018-06-13 ENCOUNTER — Ambulatory Visit (INDEPENDENT_AMBULATORY_CARE_PROVIDER_SITE_OTHER): Payer: BLUE CROSS/BLUE SHIELD

## 2018-06-13 VITALS — BP 122/72 | HR 70

## 2018-06-13 DIAGNOSIS — I10 Essential (primary) hypertension: Secondary | ICD-10-CM

## 2018-06-13 NOTE — Progress Notes (Signed)
Patient BP now in normal range. Informed to continue current dose of medication. Patient verbalized understanding. 

## 2018-08-12 ENCOUNTER — Ambulatory Visit: Payer: BLUE CROSS/BLUE SHIELD | Admitting: Internal Medicine

## 2018-08-12 ENCOUNTER — Encounter: Payer: Self-pay | Admitting: Internal Medicine

## 2018-08-12 VITALS — BP 132/78 | HR 76 | Resp 12 | Ht 60.5 in | Wt 250.0 lb

## 2018-08-12 DIAGNOSIS — J0100 Acute maxillary sinusitis, unspecified: Secondary | ICD-10-CM

## 2018-08-12 DIAGNOSIS — J3089 Other allergic rhinitis: Secondary | ICD-10-CM

## 2018-08-12 DIAGNOSIS — J4521 Mild intermittent asthma with (acute) exacerbation: Secondary | ICD-10-CM | POA: Diagnosis not present

## 2018-08-12 DIAGNOSIS — Z716 Tobacco abuse counseling: Secondary | ICD-10-CM | POA: Diagnosis not present

## 2018-08-12 MED ORDER — VARENICLINE TARTRATE 1 MG PO TABS: 1.0000 mg | ORAL_TABLET | Freq: Two times a day (BID) | ORAL | 1 refills | Status: DC

## 2018-08-12 MED ORDER — VARENICLINE TARTRATE 0.5 MG X 11 & 1 MG X 42 PO MISC: ORAL | 0 refills | Status: DC

## 2018-08-12 MED ORDER — MONTELUKAST SODIUM 10 MG PO TABS: 10.0000 mg | ORAL_TABLET | Freq: Every day | ORAL | 11 refills | Status: DC

## 2018-08-12 MED ORDER — AZITHROMYCIN 500 MG PO TABS: ORAL_TABLET | ORAL | 0 refills | Status: DC

## 2018-08-12 MED ORDER — PREDNISONE 10 MG PO TABS: ORAL_TABLET | ORAL | 0 refills | Status: DC

## 2018-08-12 NOTE — Progress Notes (Signed)
   Subjective:    Patient ID: Terri Powers, female    DOB: 04-01-1956, 62 y.o.   MRN: [id number]  HPI   Would like to reschedule CPE  For 2 weeks, sinus tightness with congestion and cough.  Having a lot of posterior pharyngeal drainage.  States mucous is just cloudy.  Feels like it is in her chest now.   No fevers.  No dyspnea.  She has been taking Tylenol cold regularly as well. She is not taking Montelukast as to much of a hassle to go across town to [place] where least costly.  She has not tried getting her meds through her [employer] insurance. She is still smoking 1/2 ppd She just cannot get the energy to quit. She did obtain some nicotine gum through an OTC giveaway.  Has not used yet.  Her boyfriend both smoke in the house.  She is interested in Chantix  Current Meds  Medication Sig  . acetaminophen (TYLENOL) 500 MG tablet Take 1,000 mg by mouth every 6 (six) hours as needed for mild pain.  Marland Kitchen albuterol (VENTOLIN HFA) 108 (90 Base) MCG/ACT inhaler 2 puffs every 6 hours as needed for wheezing  . amLODipine (NORVASC) 5 MG tablet Take 1 tablet (5 mg total) by mouth daily.  . betamethasone valerate ointment (VALISONE) 0.1 % Apply to affected areas twice daily as needed  . cetirizine (ZYRTEC) 10 MG tablet Take 1 tablet (10 mg total) by mouth daily.  . fluticasone (FLONASE) 50 MCG/ACT nasal spray Place 2 sprays into both nostrils daily.  . metoprolol tartrate (LOPRESSOR) 50 MG tablet 1 tab twice daily  . Multiple Vitamin (MULTIVITAMIN) tablet Take 1 tablet by mouth daily.    Allergies  Allergen Reactions  . Lisinopril Cough    Side effect  . Peach Flavor Hives  . Shellfish Allergy     Allergy to seafood     Review of Systems     Objective:   Physical Exam  NAD Hoarse HEENT:  PERRL, EOMI, TMs pearly gray, tender over maxillary sinuses.  Nasal mucosa swollen with clear discharge,  Throat without injection or exudate. Neck:  Supple, No adenopathy Chest:  Diffuse  mild wheeze without crackles.   CV:  RRR without murmur or rub. Radial pulses normal and equal       Assessment & Plan:  1.  Asthma exacerbation:  [facility] through her [employer] insurance so she will actually purchase as is closer to home. Prednisone burst and taper. Nurse visit in 1 week for progress report and to get influenza vaccine. Consider inhaled corticosteroids if not well controlled with her previous meds when taking all of them regularly (Montelukast and Flonase with limited use)  2.  Acute sinusitis:  Azithromycin 500 mg daily for 5 days.  Use flonase daily through her allergy season and now.  3.  Tobacco abuse:  She will speak with her boyfriend about quitting together.  He at least needs to smoke outside. Discussed directions for starter pack of Chantix and to make goal of getting rid of all smoking paraphernalia by day 8 of Chantix when goes up to 1 mg twice daily.

## 2018-08-12 NOTE — Patient Instructions (Signed)
Prednisone 10 mg tabs:  Days 1-4:  4 tabs by mouth once daily. Day 5:  3 tabs once daily Day 6:  2 tabs once daily Day 7:  1 1/2 tab by mouth Day 8:  1 tab Day 9:  1/2 tab Day 10:  Done.

## 2018-08-31 ENCOUNTER — Other Ambulatory Visit: Payer: BLUE CROSS/BLUE SHIELD

## 2018-09-07 ENCOUNTER — Encounter: Payer: BLUE CROSS/BLUE SHIELD | Admitting: Internal Medicine

## 2018-09-07 ENCOUNTER — Ambulatory Visit (INDEPENDENT_AMBULATORY_CARE_PROVIDER_SITE_OTHER): Payer: BLUE CROSS/BLUE SHIELD | Admitting: Internal Medicine

## 2018-09-07 DIAGNOSIS — Z23 Encounter for immunization: Secondary | ICD-10-CM

## 2018-09-14 ENCOUNTER — Ambulatory Visit: Payer: BLUE CROSS/BLUE SHIELD | Admitting: Internal Medicine

## 2018-09-14 ENCOUNTER — Encounter: Payer: Self-pay | Admitting: Internal Medicine

## 2018-09-14 VITALS — BP 148/82 | HR 72 | Resp 12 | Ht 60.5 in | Wt 248.0 lb

## 2018-09-14 DIAGNOSIS — Z0000 Encounter for general adult medical examination without abnormal findings: Secondary | ICD-10-CM

## 2018-09-14 DIAGNOSIS — J3089 Other allergic rhinitis: Secondary | ICD-10-CM

## 2018-09-14 DIAGNOSIS — J4521 Mild intermittent asthma with (acute) exacerbation: Secondary | ICD-10-CM

## 2018-09-14 DIAGNOSIS — Z1239 Encounter for other screening for malignant neoplasm of breast: Secondary | ICD-10-CM | POA: Diagnosis not present

## 2018-09-14 DIAGNOSIS — E7800 Pure hypercholesterolemia, unspecified: Secondary | ICD-10-CM

## 2018-09-14 DIAGNOSIS — R7303 Prediabetes: Secondary | ICD-10-CM

## 2018-09-14 DIAGNOSIS — Z780 Asymptomatic menopausal state: Secondary | ICD-10-CM | POA: Diagnosis not present

## 2018-09-14 DIAGNOSIS — Z1211 Encounter for screening for malignant neoplasm of colon: Secondary | ICD-10-CM | POA: Diagnosis not present

## 2018-09-14 DIAGNOSIS — Z716 Tobacco abuse counseling: Secondary | ICD-10-CM

## 2018-09-14 NOTE — Progress Notes (Signed)
Subjective:   Patient ID: Terri Powers, female    DOB: [birth year], 61 y.o.   MRN: [id number]  HPI   CPE without pap  1.  Pap: Had TAH/BSO in 01/04/09 for fibroids.  No family history of cervical cancer.   2.  Mammogram:  Last mammogram was 10.2017.  Had one ordered in August, but did not get call to go in.  No changes on self breast exam.  Mother diagnosed with breast cancer at age 18.  Died at age 59 of other causes.    3.  Osteoprevention:  Does not like cow's milk or almond milk.  She does eat cheese and yogurt.  Has never had DEXA bone density.  No osteoporosis history in family.  4.  Guaiac Cards:  Due for colonoscopy January 2020  5.  Colonoscopy:  Last colonoscopy with Dr. Ardis Hughs in 2009-01-04.  Internal hemorrhoids and no polyps.  Due in January for repeat.  6.  Immunizations:  Immunization History  Administered Date(s) Administered  . Influenza Inj Mdck Quad Pf 08/31/2016, 09/07/2018  . Influenza-Unspecified 07/20/2013  . Pneumococcal Polysaccharide-23 07/20/2013  . Tdap 06/23/2016    7.  Glucose/Cholesterol:  Prediabetes with A1C of 5.8% in May.  Cholesterol was high as well with LDL 178.  She has made lifestyle changes.    Lipid Panel     Component Value Date/Time   CHOL 249 (H) 02/03/2018 0906   TRIG 56 02/03/2018 0906   HDL 60 02/03/2018 0906   CHOLHDL 3.6 08/24/2016 0904   LDLCALC 178 (H) 02/03/2018 0906   Asthma and sinusitis symptoms are much better.  Finished prednisone Azithromycin about 2 weeks ago.  Has not needed albuterol in 1.5 weeks.  Had broken tooth surgically removed 2 days ago and on Amoxicillin for this.  Past Medical History:  Diagnosis Date  . Allergic rhinitis   . Allergy    Seasonal and Environmental  . Anemia   . Hypertension   . Plantar fasciitis    Past Surgical History:  Procedure Laterality Date  . ABDOMINAL HYSTERECTOMY  11/2008   TAH and BSO for fibroids    Family History  Problem Relation Age of Onset  . Diabetes  Mother   . Hypertension Mother   . Heart disease Mother        CHF-ischemic with history of MI--cause of death  . Breast cancer Mother   . Cancer Father        Lung  . Cancer Brother 47       renal cancer  . Heart failure Unknown   . Kidney cancer Unknown     Social History   Socioeconomic History  . Marital status: Widowed    Spouse name: Percell--died 01-05-16  . Number of children: 0  . Years of education: Not on file  . Highest education level: Not on file  Occupational History  . Occupation: works with intellectually disabled    Comment: [place]  . Financial resource strain: Not hard at all  . Food insecurity:    Worry: Never true    Inability: Never true  . Transportation needs:    Medical: No    Non-medical: No  Tobacco Use  . Smoking status: Current Every Day Smoker    Packs/day: 1.00    Years: 45.00    Pack years: 45.00    Types: Cigarettes  . Smokeless tobacco: Never Used  . Tobacco comment: Has not set hard quit date as discussed  at last visit 08/2018.  Her boyfriend keeps going out and getting more cigarettes.    Substance and Sexual Activity  . Alcohol use: Yes    Alcohol/week: 0.0 standard drinks    Comment: Occasional use  . Drug use: No    Comment: Quit before 2010  . Sexual activity: Yes    Comment: Boyfriend for a year--Darrell 2019  Lifestyle  . Physical activity:    Days per week: 7 days    Minutes per session: 70 min  . Stress: Not at all  Relationships  . Social connections:    Talks on phone: More than three times a week    Gets together: More than three times a week    Attends religious service: More than 4 times per year    Active member of club or organization: Not on file    Attends meetings of clubs or organizations: Never    Relationship status: Living with partner  . Intimate partner violence:    Fear of current or ex partner: No    Emotionally abused: No    Physically abused: No    Forced sexual  activity: No  Other Topics Concern  . 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   Looking for work.     No children.   Lives now with just her mother at home.    Review of Systems  Constitutional: Negative for appetite change and fatigue.  HENT: Positive for dental problem (Tooth extracted 2 days ago). Negative for hearing loss.   Eyes: Positive for visual disturbance (Wears contacts.  [employer]  Respiratory: Negative for cough and wheezing.   Cardiovascular: Negative for chest pain, palpitations and leg swelling.  Gastrointestinal: Negative for abdominal pain, blood in stool (No melena), constipation (Soft if takes stool softener every other day) and diarrhea.  Genitourinary: Negative for dysuria, frequency, vaginal bleeding and vaginal discharge.  Musculoskeletal: Negative for arthralgias.  Skin: Negative for rash.       Eczema controlled  Neurological: Negative for weakness, numbness and headaches.  Hematological: Does not bruise/bleed easily.  Psychiatric/Behavioral: Negative for dysphoric mood. The patient is nervous/anxious (Rare and only when gets overwhelmed).       Objective:  Physical Exam Constitutional:      Appearance: She is obese.  HENT:     Head: Normocephalic and atraumatic.     Right Ear: Hearing, tympanic membrane, ear canal and external ear normal.     Left Ear: Hearing, tympanic membrane, ear canal and external ear normal.     Nose: Nose normal.     Mouth/Throat:     Mouth: Mucous membranes are moist.     Pharynx: Oropharynx is clear.  Eyes:     Extraocular Movements: Extraocular movements intact.     Conjunctiva/sclera: Conjunctivae normal.     Pupils: Pupils are equal, round, and reactive to light.      Funduscopic exam:    Right eye: No hemorrhage or papilledema. Red reflex present.        Left eye: No hemorrhage or papilledema. Red reflex present. Neck:     Musculoskeletal: Full passive range of motion without pain, normal range of motion and neck supple. No muscular  tenderness.  Cardiovascular:     Rate and Rhythm: Normal rate and regular rhythm.     Heart sounds: S1 normal and S2 normal. No murmur. No friction rub. No S3 or S4 sounds.      Comments: No carotid bruits.  Carotid, radial, femoral, DP and PT pulses normal and equal.  Pulmonary:     Effort: Pulmonary effort is normal.     Breath sounds: Normal breath sounds.  Chest:     Breasts:        Right: No mass, nipple discharge or skin change.        Left: No mass, nipple discharge or skin change.  Abdominal:     General: Bowel sounds are normal.     Palpations: Abdomen is soft. There is no hepatomegaly, splenomegaly or mass.     Tenderness: There is no abdominal tenderness.     Hernia: No hernia is present.     Comments: Dry line of abdominal skin fold--hyperpigemented  Genitourinary:    Rectum: Guaiac result negative. No mass.  Musculoskeletal: Normal range of motion.  Lymphadenopathy:     Head:     Right side of head: No submental or submandibular adenopathy.     Left side of head: No submental or submandibular adenopathy.     Cervical: No cervical adenopathy.     Upper Body:     Right upper body: No axillary adenopathy.     Left upper body: No axillary adenopathy.     Lower Body: No right inguinal adenopathy. No left inguinal adenopathy.  Skin:    General: Skin is warm.     Capillary Refill: Capillary refill takes less than 2 seconds.     Findings: No rash.  Neurological:     Mental Status: She is alert and oriented to person, place, and time.     Cranial Nerves: Cranial nerves are intact.     Sensory: Sensation is intact.     Motor: Motor function is intact.     Coordination: Coordination is intact.     Gait:  Gait is intact.     Deep Tendon Reflexes: Reflexes are normal and symmetric.  Psychiatric:        Mood and Affect: Mood normal.        Behavior: Behavior normal.        Thought Content: Thought content normal.        Judgment: Judgment normal.    Blood pressure (!) 148/82, pulse 72, resp. rate 12, height 5' 0.5" (1.537 m), weight 248 lb (112.5 kg).       Assessment & Plan:  1.  CPE:  Colonoscopy next month.  Referral to GI Mammogram and bone density ordered. FLP and A1C in 1 week.  2.  Hypertension:  Encouraged making small goals for lifestyle changes.   Recheck with fasting labs in 1 week.  Take med that morning.  3.  Allergies and Asthma:  Now under better control.

## 2018-09-14 NOTE — Patient Instructions (Signed)
Can google "advance directives, [place]"  And bring up form from Secretary of State. Print and fill out Or can go to "5 wishes"  Which is also in Spanish and fill out--this costs $5--perhaps easier to use. Designate a Medical Power of Attorney to speak for you if you are unable to speak for yourself when ill or injured  

## 2018-09-20 ENCOUNTER — Other Ambulatory Visit: Payer: BLUE CROSS/BLUE SHIELD

## 2018-10-09 ENCOUNTER — Encounter: Payer: Self-pay | Admitting: Gastroenterology

## 2018-12-13 ENCOUNTER — Encounter: Payer: Self-pay | Admitting: Internal Medicine

## 2018-12-24 ENCOUNTER — Other Ambulatory Visit: Payer: Self-pay | Admitting: Internal Medicine

## 2018-12-26 ENCOUNTER — Telehealth: Payer: Self-pay | Admitting: Internal Medicine

## 2018-12-26 ENCOUNTER — Other Ambulatory Visit: Payer: Self-pay | Admitting: Internal Medicine

## 2018-12-26 NOTE — Telephone Encounter (Signed)
Spoke with patient. Informed [facility]

## 2018-12-26 NOTE — Telephone Encounter (Signed)
Patient called requesting Rx on metoprolol tartrate (LOPRESSOR) 50 MG tablet. Patient states has only two pills left.   Please advise.

## 2019-03-28 ENCOUNTER — Telehealth: Payer: Self-pay | Admitting: Internal Medicine

## 2019-03-28 MED ORDER — AMLODIPINE BESYLATE 5 MG PO TABS: 5.0000 mg | ORAL_TABLET | Freq: Every day | ORAL | 1 refills | Status: DC

## 2019-03-28 MED ORDER — METOPROLOL TARTRATE 50 MG PO TABS: 50.0000 mg | ORAL_TABLET | Freq: Two times a day (BID) | ORAL | 1 refills | Status: DC

## 2019-03-28 NOTE — Telephone Encounter (Signed)
Patient called requesting Rx on Amlodipine, Metoprolol to be called in at [facility] in [facility].  Please advise.

## 2019-03-28 NOTE — Telephone Encounter (Signed)
Rx sent to pharmacy   

## 2019-03-29 ENCOUNTER — Other Ambulatory Visit: Payer: Self-pay

## 2019-03-29 ENCOUNTER — Other Ambulatory Visit (INDEPENDENT_AMBULATORY_CARE_PROVIDER_SITE_OTHER): Payer: BLUE CROSS/BLUE SHIELD

## 2019-03-29 VITALS — BP 150/80 | HR 58

## 2019-03-29 DIAGNOSIS — R7303 Prediabetes: Secondary | ICD-10-CM | POA: Diagnosis not present

## 2019-03-29 DIAGNOSIS — Z13220 Encounter for screening for lipoid disorders: Secondary | ICD-10-CM | POA: Diagnosis not present

## 2019-03-29 DIAGNOSIS — I10 Essential (primary) hypertension: Secondary | ICD-10-CM

## 2019-03-29 MED ORDER — METOPROLOL SUCCINATE ER 100 MG PO TB24: 100.0000 mg | ORAL_TABLET | Freq: Every day | ORAL | 3 refills | Status: DC

## 2019-03-29 NOTE — Progress Notes (Signed)
Patient BP still elevated. Patient had been taking Metoprolol 50 mg 2 at one time instead of twice a day. Per Dr. Amil Amen change to Metoprolol extended release 100 mg once a day. Patient verbalized understanding. New Rx sent to pharmacy and patient scheduled repeat BP check in 3 weeks.

## 2019-03-30 LAB — LIPID PANEL W/O CHOL/HDL RATIO
Cholesterol, Total: 236 mg/dL — ABNORMAL HIGH (ref 100–199)
HDL: 54 mg/dL (ref >39)
LDL Calculated: 170 mg/dL — ABNORMAL HIGH (ref 0–99)
Triglycerides: 61 mg/dL (ref 0–149)
VLDL Cholesterol Cal: 12 mg/dL (ref 5–40)

## 2019-03-30 LAB — HGB A1C W/O EAG: Hgb A1c MFr Bld: 6 % — ABNORMAL HIGH (ref 4.8–5.6)

## 2019-04-19 ENCOUNTER — Ambulatory Visit (INDEPENDENT_AMBULATORY_CARE_PROVIDER_SITE_OTHER): Payer: BLUE CROSS/BLUE SHIELD

## 2019-04-19 VITALS — BP 122/78 | HR 78

## 2019-04-19 DIAGNOSIS — I10 Essential (primary) hypertension: Secondary | ICD-10-CM | POA: Diagnosis not present

## 2019-04-19 NOTE — Progress Notes (Signed)
Patient BP now in normal range informed to continue current dose of medication. patient verbalized understanding.

## 2019-09-12 ENCOUNTER — Other Ambulatory Visit: Payer: Self-pay

## 2019-09-12 MED ORDER — MONTELUKAST SODIUM 10 MG PO TABS: 10.0000 mg | ORAL_TABLET | Freq: Every day | ORAL | 11 refills | Status: DC

## 2019-09-12 MED ORDER — CETIRIZINE HCL 10 MG PO TABS: 10.0000 mg | ORAL_TABLET | Freq: Every day | ORAL | 11 refills | Status: DC

## 2019-09-12 MED ORDER — AMLODIPINE BESYLATE 5 MG PO TABS: 5.0000 mg | ORAL_TABLET | Freq: Every day | ORAL | 1 refills | Status: DC

## 2019-10-16 ENCOUNTER — Ambulatory Visit: Payer: Self-pay | Attending: Internal Medicine

## 2019-10-16 DIAGNOSIS — Z20822 Contact with and (suspected) exposure to covid-19: Secondary | ICD-10-CM | POA: Insufficient documentation

## 2019-10-18 LAB — NOVEL CORONAVIRUS, NAA: SARS-CoV-2, NAA: NOT DETECTED

## 2019-11-06 ENCOUNTER — Ambulatory Visit (INDEPENDENT_AMBULATORY_CARE_PROVIDER_SITE_OTHER): Payer: Self-pay | Admitting: Internal Medicine

## 2019-11-06 ENCOUNTER — Encounter: Payer: Self-pay | Admitting: Internal Medicine

## 2019-11-06 ENCOUNTER — Other Ambulatory Visit: Payer: Self-pay

## 2019-11-06 VITALS — BP 138/82 | HR 86 | Resp 12 | Ht 61.0 in | Wt 259.0 lb

## 2019-11-06 DIAGNOSIS — I10 Essential (primary) hypertension: Secondary | ICD-10-CM

## 2019-11-06 DIAGNOSIS — R7303 Prediabetes: Secondary | ICD-10-CM

## 2019-11-06 DIAGNOSIS — Z716 Tobacco abuse counseling: Secondary | ICD-10-CM

## 2019-11-06 DIAGNOSIS — J4521 Mild intermittent asthma with (acute) exacerbation: Secondary | ICD-10-CM

## 2019-11-06 DIAGNOSIS — E7800 Pure hypercholesterolemia, unspecified: Secondary | ICD-10-CM

## 2019-11-06 DIAGNOSIS — J3089 Other allergic rhinitis: Secondary | ICD-10-CM

## 2019-11-06 MED ORDER — ALBUTEROL SULFATE HFA 108 (90 BASE) MCG/ACT IN AERS: INHALATION_SPRAY | RESPIRATORY_TRACT | 2 refills | Status: DC

## 2019-11-06 MED ORDER — BETAMETHASONE VALERATE 0.1 % EX OINT: TOPICAL_OINTMENT | CUTANEOUS | 4 refills | Status: DC

## 2019-11-06 NOTE — Progress Notes (Signed)
6-    Subjective:    Patient ID: Terri Powers, female   DOB: 16-Jul-1956, 64 y.o.   MRN: [id number]   HPI   In past 2 weeks:    1.  Right abdominal pain:  Was constipated.  Hard stools, RLQ discomfort with bloating and heaviness and sense she had to go, but little would come out.  Took stool softeners, increased her fluids. Once she started having soft stools, the discomfort gradually resolved.    2.  Furuncle on right genital/labial area:  Applied hot compress and opened and drained.  3.  Hypertension:  Tolerating meds fine.  Metoprolol and amlodipine.  4.  Allergies/Asthma:  Stable.  Using rescue inhaler maybe 2 times weekly.  5.  Prediabetes:  Has date with a trainer today.  Has put on some weight since last visit. She is not fasting today  6.  Hypercholesterolemia:  Not at goal in June 2020.  7.  Tobacco Abuse:  Did not really try to quit as recommended on Chantix.  She and fiance, Darryl, both smoke and will discuss the possibility of quitting together.   Current Meds  Medication Sig  . acetaminophen (TYLENOL) 500 MG tablet Take 1,000 mg by mouth every 6 (six) hours as needed for mild pain.  Marland Kitchen albuterol (VENTOLIN HFA) 108 (90 Base) MCG/ACT inhaler 2 puffs every 6 hours as needed for wheezing  . amLODipine (NORVASC) 5 MG tablet Take 1 tablet (5 mg total) by mouth daily.  . cetirizine (ZYRTEC) 10 MG tablet Take 1 tablet (10 mg total) by mouth daily.  . fluticasone (FLONASE) 50 MCG/ACT nasal spray Place 2 sprays into both nostrils daily.  Marland Kitchen ibuprofen (ADVIL,MOTRIN) 800 MG tablet Take 800 mg by mouth every 8 (eight) hours as needed. for pain  . metoprolol succinate (TOPROL XL) 100 MG 24 hr tablet Take 1 tablet (100 mg total) by mouth daily. Take with or immediately following a meal.  . montelukast (SINGULAIR) 10 MG tablet Take 1 tablet (10 mg total) by mouth at bedtime.  . Multiple Vitamin (MULTIVITAMIN) tablet Take 1 tablet by mouth daily.  . [DISCONTINUED]  diclofenac (VOLTAREN) 75 MG EC tablet    Allergies  Allergen Reactions  . Lisinopril Cough    Side effect  . Peach Flavor Hives  . Shellfish Allergy     Allergy to seafood     Review of Systems    Objective:   BP 138/82 (BP Location: Left Arm, Patient Position: Sitting, Cuff Size: Large)   Pulse 86   Resp 12   Ht 5\' 1"  (1.549 m)   Wt 259 lb (117.5 kg)   BMI 48.94 kg/m   Physical Exam  NAD HEENT:  PERRL, EOMI, TMs pearly gray, Nasal mucosa without swelling or erythema Neck:  Supple, No adenopathy Chest:  CTA CV:  RRR without murmur or rub .  Radial and DP pulses normal and equal.   LE without edema. Abd:  S, NT, No HSM or mass, + BS   Assessment & Plan  1.  R lower quad pain:  Resolved and exam normal today  2.  Furuncle, Right genital area--resolved.  3.  Obesity:  Discussed how this affects bp, cholesterol, prediabetes, asthma.  Discussed daily and weekley goals for diet and physical activity.  4.  Hypertension:  Controlled  5.  Hypercholesterolemia--return for fasting labs in next week.  As above with lifestyle  6.  Asthma and allergies:  Fair control  Weight loss and smoking cessation to  continue to improve  7.  Tobacco abuse counseling:  Needs to actually get rid of smoking materials with a goal to do so starting the second week of Chantix.  Discussed would recommend having her boyfriend quit at same time so smoking materials not around if she chooses to try another round of Chantix.  A1C, FLP, CMP, CBC in next week. Refilled Beclomethasone topically

## 2019-11-14 ENCOUNTER — Other Ambulatory Visit: Payer: Self-pay

## 2019-11-14 ENCOUNTER — Other Ambulatory Visit (INDEPENDENT_AMBULATORY_CARE_PROVIDER_SITE_OTHER): Payer: Self-pay

## 2019-11-14 DIAGNOSIS — E7800 Pure hypercholesterolemia, unspecified: Secondary | ICD-10-CM

## 2019-11-14 DIAGNOSIS — R7303 Prediabetes: Secondary | ICD-10-CM

## 2019-11-14 DIAGNOSIS — I10 Essential (primary) hypertension: Secondary | ICD-10-CM

## 2019-11-15 LAB — COMPREHENSIVE METABOLIC PANEL
ALT: 9 IU/L (ref 0–32)
AST: 17 IU/L (ref 0–40)
Albumin/Globulin Ratio: 1.6 (ref 1.2–2.2)
Albumin: 3.9 g/dL (ref 3.8–4.8)
Alkaline Phosphatase: 93 IU/L (ref 39–117)
BUN/Creatinine Ratio: 13 (ref 12–28)
BUN: 10 mg/dL (ref 8–27)
Bilirubin Total: 0.4 mg/dL (ref 0.0–1.2)
CO2: 26 mmol/L (ref 20–29)
Calcium: 8.6 mg/dL — ABNORMAL LOW (ref 8.7–10.3)
Chloride: 103 mmol/L (ref 96–106)
Creatinine, Ser: 0.76 mg/dL (ref 0.57–1.00)
GFR calc Af Amer: 98 mL/min/{1.73_m2} (ref >59)
GFR calc non Af Amer: 85 mL/min/{1.73_m2} (ref >59)
Globulin, Total: 2.4 g/dL (ref 1.5–4.5)
Glucose: 115 mg/dL — ABNORMAL HIGH (ref 65–99)
Potassium: 5.1 mmol/L (ref 3.5–5.2)
Sodium: 141 mmol/L (ref 134–144)
Total Protein: 6.3 g/dL (ref 6.0–8.5)

## 2019-11-15 LAB — CBC WITH DIFFERENTIAL/PLATELET
Basophils Absolute: 0 10*3/uL (ref 0.0–0.2)
Basos: 1 %
EOS (ABSOLUTE): 0.2 10*3/uL (ref 0.0–0.4)
Eos: 5 %
Hematocrit: 40.9 % (ref 34.0–46.6)
Hemoglobin: 13.3 g/dL (ref 11.1–15.9)
Immature Grans (Abs): 0 10*3/uL (ref 0.0–0.1)
Immature Granulocytes: 0 %
Lymphocytes Absolute: 2.3 10*3/uL (ref 0.7–3.1)
Lymphs: 44 %
MCH: 30 pg (ref 26.6–33.0)
MCHC: 32.5 g/dL (ref 31.5–35.7)
MCV: 92 fL (ref 79–97)
Monocytes Absolute: 0.5 10*3/uL (ref 0.1–0.9)
Monocytes: 9 %
Neutrophils Absolute: 2.1 10*3/uL (ref 1.4–7.0)
Neutrophils: 41 %
Platelets: 326 10*3/uL (ref 150–450)
RBC: 4.44 x10E6/uL (ref 3.77–5.28)
RDW: 13.4 % (ref 11.7–15.4)
WBC: 5.2 10*3/uL (ref 3.4–10.8)

## 2019-11-15 LAB — LIPID PANEL W/O CHOL/HDL RATIO
Cholesterol, Total: 238 mg/dL — ABNORMAL HIGH (ref 100–199)
HDL: 50 mg/dL (ref >39)
LDL Chol Calc (NIH): 179 mg/dL — ABNORMAL HIGH (ref 0–99)
Triglycerides: 57 mg/dL (ref 0–149)
VLDL Cholesterol Cal: 9 mg/dL (ref 5–40)

## 2019-11-15 LAB — HGB A1C W/O EAG: Hgb A1c MFr Bld: 6 % — ABNORMAL HIGH (ref 4.8–5.6)

## 2019-12-19 ENCOUNTER — Other Ambulatory Visit: Payer: Self-pay | Admitting: Internal Medicine

## 2019-12-19 MED ORDER — SIMVASTATIN 40 MG PO TABS: 40.0000 mg | ORAL_TABLET | Freq: Every day | ORAL | 11 refills | Status: DC

## 2020-01-08 ENCOUNTER — Telehealth: Payer: Self-pay | Admitting: Internal Medicine

## 2020-01-08 NOTE — Telephone Encounter (Signed)
Patient called requesting a call back from [place] to discuss side effect patient is having x a month since started taking simvastatin (ZOCOR) 40 MG tablet. Patient stated is having diarrhea every time she eats x a month now and everything started with the medication. Patient states she still taking the medication but she cannot take the diarrhea no more.  Cherice please contact patient and advise.

## 2020-01-09 NOTE — Telephone Encounter (Signed)
Left message for patient to call back  

## 2020-01-10 NOTE — Telephone Encounter (Signed)
Spoke with patient. States she is having diarrhea 3- times a week after starting simvastatin. Wants to know if there is something else she can take besides this. States she is still taking the medication right now but she really doesn't want to. Informed I will speak with Dr. Amil Amen and call her back

## 2020-01-10 NOTE — Telephone Encounter (Signed)
Left detailed message for patient to stop simvastatin for now then call once diarrhea stops to switch to a different cholesterol medication. Asked for a call back to confirm receipt of message

## 2020-01-11 NOTE — Telephone Encounter (Signed)
Patient called back and let me know she received the message

## 2020-02-06 ENCOUNTER — Other Ambulatory Visit (INDEPENDENT_AMBULATORY_CARE_PROVIDER_SITE_OTHER): Payer: Self-pay

## 2020-02-06 ENCOUNTER — Other Ambulatory Visit: Payer: Self-pay

## 2020-02-06 DIAGNOSIS — E7800 Pure hypercholesterolemia, unspecified: Secondary | ICD-10-CM

## 2020-02-06 DIAGNOSIS — Z79899 Other long term (current) drug therapy: Secondary | ICD-10-CM

## 2020-02-07 LAB — LIPID PANEL W/O CHOL/HDL RATIO
Cholesterol, Total: 249 mg/dL — ABNORMAL HIGH (ref 100–199)
HDL: 54 mg/dL (ref >39)
LDL Chol Calc (NIH): 186 mg/dL — ABNORMAL HIGH (ref 0–99)
Triglycerides: 59 mg/dL (ref 0–149)
VLDL Cholesterol Cal: 9 mg/dL (ref 5–40)

## 2020-02-07 LAB — HEPATIC FUNCTION PANEL
ALT: 12 IU/L (ref 0–32)
AST: 16 IU/L (ref 0–40)
Albumin: 4.1 g/dL (ref 3.8–4.8)
Alkaline Phosphatase: 96 IU/L (ref 39–117)
Bilirubin Total: 0.4 mg/dL (ref 0.0–1.2)
Bilirubin, Direct: 0.1 mg/dL (ref 0.00–0.40)
Total Protein: 6.5 g/dL (ref 6.0–8.5)

## 2020-02-12 ENCOUNTER — Telehealth: Payer: Self-pay | Admitting: Internal Medicine

## 2020-02-12 NOTE — Telephone Encounter (Signed)
Patient called the clinic after received a message from Dr. Amil Amen through my Chart (Please call the clinic on Monday and let me know if you are taking the Simvastatin EVERY [place]. Your cholesterol is actually going up.)  Patient stated stopped taking medication like a month ago after instructed by Cherice when pt. Called stating medication was making her has diahrrea 3 times a week.

## 2020-02-12 NOTE — Telephone Encounter (Signed)
Sent Dr. Amil Amen

## 2020-02-16 MED ORDER — ROSUVASTATIN CALCIUM 10 MG PO TABS: ORAL_TABLET | ORAL | 11 refills | Status: DC

## 2020-02-16 NOTE — Telephone Encounter (Signed)
Spoke with patient. Change discussed. Lab appointment scheduled

## 2020-02-16 NOTE — Telephone Encounter (Signed)
Patient called to inquiring update call made 02/12/20 on what to do or take for cholesterol going up.

## 2020-02-16 NOTE — Telephone Encounter (Signed)
Side effects from Simvastatin--switching to Rosuvastatin.

## 2020-03-29 ENCOUNTER — Other Ambulatory Visit: Payer: Self-pay

## 2020-03-29 ENCOUNTER — Other Ambulatory Visit (INDEPENDENT_AMBULATORY_CARE_PROVIDER_SITE_OTHER): Payer: Self-pay

## 2020-03-29 DIAGNOSIS — E7800 Pure hypercholesterolemia, unspecified: Secondary | ICD-10-CM

## 2020-03-30 LAB — LIPID PANEL W/O CHOL/HDL RATIO
Cholesterol, Total: 185 mg/dL (ref 100–199)
HDL: 51 mg/dL (ref >39)
LDL Chol Calc (NIH): 122 mg/dL — ABNORMAL HIGH (ref 0–99)
Triglycerides: 65 mg/dL (ref 0–149)
VLDL Cholesterol Cal: 12 mg/dL (ref 5–40)

## 2020-04-12 ENCOUNTER — Telehealth: Payer: Self-pay | Admitting: Internal Medicine

## 2020-04-12 ENCOUNTER — Other Ambulatory Visit: Payer: Self-pay

## 2020-04-12 MED ORDER — METOPROLOL SUCCINATE ER 100 MG PO TB24: 100.0000 mg | ORAL_TABLET | Freq: Every day | ORAL | 3 refills | Status: DC

## 2020-04-12 NOTE — Telephone Encounter (Signed)
Patient requesting Rx on metoprolol succinate (TOPROL XL) 100 MG 24 hr tablet to be called in at the default pharmacy.  Patient is out of medication completely.

## 2020-04-12 NOTE — Telephone Encounter (Signed)
Rx  sent  to  [facility]

## 2020-04-16 ENCOUNTER — Other Ambulatory Visit: Payer: Self-pay | Admitting: Internal Medicine

## 2020-05-03 ENCOUNTER — Telehealth: Payer: Self-pay

## 2020-05-03 NOTE — Telephone Encounter (Signed)
Patient was called to reschedule her CPE. Patient states she has been experiencing neck and shoulder pain x 3 months. Patient states she is still taking the rosuvastatin.   Spoke with Dr. Amil Amen. States to hold rosuvastatin for 1 month. For patient to keep follow up in September but to call in 1 month with how she is doing and if pain has subsided. Spoke with patient informed patient of message. patient verbalized understanding of holding her cholesterol medication and calling in 1 month.

## 2020-05-06 ENCOUNTER — Encounter: Payer: BLUE CROSS/BLUE SHIELD | Admitting: Internal Medicine

## 2020-07-02 ENCOUNTER — Encounter: Payer: Self-pay | Admitting: Internal Medicine

## 2020-07-02 ENCOUNTER — Ambulatory Visit: Payer: BLUE CROSS/BLUE SHIELD | Admitting: Internal Medicine

## 2020-07-02 VITALS — BP 150/82 | HR 68 | Resp 19 | Ht 60.5 in | Wt 269.0 lb

## 2020-07-02 DIAGNOSIS — Z6841 Body Mass Index (BMI) 40.0 and over, adult: Secondary | ICD-10-CM

## 2020-07-02 DIAGNOSIS — Z79899 Other long term (current) drug therapy: Secondary | ICD-10-CM

## 2020-07-02 DIAGNOSIS — M7581 Other shoulder lesions, right shoulder: Secondary | ICD-10-CM

## 2020-07-02 DIAGNOSIS — Z1211 Encounter for screening for malignant neoplasm of colon: Secondary | ICD-10-CM

## 2020-07-02 DIAGNOSIS — Z716 Tobacco abuse counseling: Secondary | ICD-10-CM

## 2020-07-02 DIAGNOSIS — L309 Dermatitis, unspecified: Secondary | ICD-10-CM

## 2020-07-02 DIAGNOSIS — R7303 Prediabetes: Secondary | ICD-10-CM

## 2020-07-02 DIAGNOSIS — I10 Essential (primary) hypertension: Secondary | ICD-10-CM

## 2020-07-02 DIAGNOSIS — E7800 Pure hypercholesterolemia, unspecified: Secondary | ICD-10-CM

## 2020-07-02 DIAGNOSIS — Z1231 Encounter for screening mammogram for malignant neoplasm of breast: Secondary | ICD-10-CM

## 2020-07-02 DIAGNOSIS — Z0000 Encounter for general adult medical examination without abnormal findings: Secondary | ICD-10-CM

## 2020-07-02 DIAGNOSIS — N3941 Urge incontinence: Secondary | ICD-10-CM

## 2020-07-02 MED ORDER — CLOBETASOL PROPIONATE 0.05 % EX CREA: 1.0000 "application " | TOPICAL_CREAM | Freq: Two times a day (BID) | CUTANEOUS | 2 refills | Status: DC

## 2020-07-02 NOTE — Patient Instructions (Addendum)
Can google "advance directives, [place]"  And bring up form from Secretary of [place]. Print and fill out Or can go to "5 wishes"  Which is also in Spanish and fill out--this costs $5--perhaps easier to use. Designate a Forensic scientist to speak for you if you are unable to speak for yourself when ill or injured   Make Goals--write them down and check them off  Get a calendar to write down goals  You have two days to get the calendar and get started.  Drink a glass of water before every meal Drink 6-8 glasses of water daily Eat three meals daily Eat a protein and healthy fat with every meal (eggs,fish, chicken, [place] and limit red meats) Eat 5 servings of vegetables daily, mix the colors Eat 2 servings of fruit daily with skin, if skin is edible Use smaller plates Put food/utensils down as you chew and swallow each bite Eat at a table with friends/family at least once daily, no TV Do not eat in front of the TV  Recent studies show that people who consume all of their calories in a 12 hour period lose weight more efficiently.  For example, if you eat your first meal at 7:00 a.m., your last meal of the day should be completed by 7:00 p.m.

## 2020-07-02 NOTE — Progress Notes (Signed)
Subjective:    Patient ID: Terri Powers, female   DOB: 23-Nov-1955, 64 y.o.   MRN: [id number]   HPI   CPE without pap  1.  Pap:  History of hysterectomy with BSO for fibroids in 2010.    2.  Mammogram:  Has not had mammogram since 2017.  Always normal in past.  Mother with history of breast cancer.  She died at 62 yo of other causes.  3.  Osteoprevention:  Has never had a DEXA.  One serving of dairy daily.  Not physically active for past 2 weeks.    4.  Guaiac Cards:  Has never performed stool cards.  5.  Colonoscopy:  Last colonoscopy with Dr. Ardis Hughs in 2010.  No polyps.  No family history of colon cancer.   6.  Immunizations:   Immunization History  Administered Date(s) Administered  . Influenza Inj Mdck Quad Pf 08/31/2016, 09/07/2018  . Influenza,inj,Quad PF,6+ Mos 07/15/2019  . Influenza-Unspecified 07/20/2013  . Moderna SARS-COVID-2 Vaccination 12/04/2019, 12/27/2019  . Pneumococcal Polysaccharide-23 07/20/2013  . Tdap 06/23/2016     7.  Glucose/Cholesterol:  Prediabetes with A1C of 6.0%.  Cholesterol was much improved when taking Crestor in June, but as noted below, stopped the medication.  Lipid Panel     Component Value Date/Time   CHOL 185 03/29/2020 0846   TRIG 65 03/29/2020 0846   HDL 51 03/29/2020 0846   CHOLHDL 3.6 08/24/2016 0904   LDLCALC 122 (H) 03/29/2020 0846   LABVLDL 12 03/29/2020 0846     8.  Other:  Stopped statins, even Crestor as developed pain in right shoulder and right lateral thigh.  She stopped the Crestor initially as she thought it might be a side effect, but has since been diagnosed with bursitis of right greater trochanter and right subacromial bursa from her description.  Was seen by physician at [facility].    Current Meds  Medication Sig  . acetaminophen (TYLENOL) 500 MG tablet Take 1,000 mg by mouth every 6 (six) hours as needed for mild pain.  Marland Kitchen albuterol (VENTOLIN HFA) 108 (90 Base) MCG/ACT inhaler 2 puffs every 6  hours as needed for wheezing  . amLODipine (NORVASC) 5 MG tablet Take 1 tablet by mouth once daily  . betamethasone valerate ointment (VALISONE) 0.1 % Apply to affected areas twice daily as needed  . cetirizine (ZYRTEC) 10 MG tablet Take 1 tablet (10 mg total) by mouth daily.  . fluticasone (FLONASE) 50 MCG/ACT nasal spray Place 2 sprays into both nostrils daily.  Marland Kitchen ibuprofen (ADVIL,MOTRIN) 800 MG tablet Take 800 mg by mouth every 8 (eight) hours as needed. for pain  . metoprolol succinate (TOPROL XL) 100 MG 24 hr tablet Take 1 tablet (100 mg total) by mouth daily. Take with or immediately following a meal.  . montelukast (SINGULAIR) 10 MG tablet Take 1 tablet (10 mg total) by mouth at bedtime.  . Multiple Vitamin (MULTIVITAMIN) tablet Take 1 tablet by mouth daily.   Allergies  Allergen Reactions  . Lisinopril Cough    Side effect  . Peach Flavor Hives  . Shellfish Allergy     Allergy to seafood   Past Medical History:  Diagnosis Date  . Allergic rhinitis   . Allergy    Seasonal and Environmental  . Anemia   . Hypertension   . Obesity   . Plantar fasciitis   . Prediabetes     Past Surgical History:  Procedure Laterality Date  . ABDOMINAL HYSTERECTOMY  11/2008   TAH and BSO for fibroids    Family History  Problem Relation Age of Onset  . Diabetes Mother   . Hypertension Mother   . Heart disease Mother        CHF-ischemic with history of MI--cause of death  . Breast cancer Mother 60  . Cancer Father        Lung  . Cancer Brother 59       renal cancer  . Heart failure Other   . Kidney cancer Other     Social History   Socioeconomic History  . Marital status: Married    Spouse name: Darryl  . Number of children: 0  . Years of education: Not on file  . Highest education level: Not on file  Occupational History  . Occupation: works with intellectually disabled    Comment: [place]  Tobacco Use  . Smoking status: Current Every Day Smoker     Packs/day: 1.00    Years: 45.00    Pack years: 45.00    Types: Cigarettes  . Smokeless tobacco: Never Used  . Tobacco comment: Has not set hard quit date as discussed at last visit 08/2018.  Her boyfriend keeps going out and getting more cigarettes.    Substance and Sexual Activity  . Alcohol use: Yes    Alcohol/week: 0.0 standard drinks    Comment: Occasional use  . Drug use: No    Comment: Quit before 2010  . Sexual activity: Yes    Birth control/protection: Surgical    Comment: Boyfriend for a year--Darrell 2019  Other Topics Concern  . 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   Working now with disabled adults     No children.   Lives with second husband, [name] and one of her clients, age 45 yo   Social Determinants of Radio broadcast assistant Strain:   . Difficulty of Paying Living Expenses: Not on file  Food Insecurity: No Food Insecurity  . Worried About Charity fundraiser in the Last Year: Never true  . Ran Out of Food in the Last Year: Never true  Transportation Needs: No Transportation Needs  . Lack of Transportation (Medical): No  . Lack of Transportation (Non-Medical): No  Physical Activity:   . Days of Exercise per Week: Not on file  . Minutes of Exercise per Session: Not on file  Stress:   . Feeling of Stress : Not on file  Social Connections:   . Frequency of Communication with Friends and Family: Not on file  . Frequency of Social Gatherings with Friends and Family: Not on file  . Attends Religious Services: Not on file  . Active Member of Clubs or Organizations: Not on file  . Attends Archivist Meetings: Not on file  . Marital Status: Not on file  Intimate Partner Violence: Not At Risk  . Fear of Current  or Ex-Partner: No  . Emotionally Abused: No  . Physically Abused: No  . Sexually Abused: No     Review of Systems  Constitutional: Negative for appetite change and fatigue.  HENT: Positive for dental problem (has a tooth that requires removal, but not ready to go  through this.), postnasal drip and rhinorrhea. Negative for hearing loss.   Eyes: Positive for itching (Controlled fairly well with allergy medication.).  Respiratory: Positive for cough. Negative for shortness of breath.   Cardiovascular: Negative for chest pain, palpitations and leg swelling.  Gastrointestinal: Negative for abdominal pain, blood in stool (No melena), constipation and diarrhea.       Burning in chest and up into throat.  If belches, pressure pain resolves. Symptoms tend to be later in day when lying down and watching TV.  Reclined generally.   Can elevated head of bed. Started OTC Prilosec 20 mg as needed.  Generally never more than a week.   Tomatoes, onions are big in her diet.  Drinks coffee generally just in the morning.  Some alcohol--not frequent.  Still smoking.  Does drink [facility].  1 at dinner.    Genitourinary: Positive for frequency. Negative for dysuria and vaginal discharge.  Musculoskeletal: Positive for arthralgias (Right shoulder still a problem).  Skin: Positive for rash (Right ankle eczema).  Neurological: Negative for weakness and numbness.  Psychiatric/Behavioral: Negative for dysphoric mood. The patient is not nervous/anxious.       Objective:   BP (!) 150/82 (BP Location: Left Arm, Patient Position: Sitting, Cuff Size: Normal)   Pulse 68   Resp 19   Ht 5' 0.5" (1.537 m)   Wt 269 lb (122 kg)   BMI 51.67 kg/m   Physical Exam Constitutional:      Appearance: She is obese.  HENT:     Head: Normocephalic and atraumatic.     Jaw: There is normal jaw occlusion.     Right Ear: Hearing, tympanic membrane, ear canal and external ear normal.     Left Ear: Hearing, tympanic  membrane, ear canal and external ear normal.     Nose: Nose normal.     Mouth/Throat:     Mouth: Mucous membranes are moist.     Pharynx: Oropharynx is clear.  Eyes:     Extraocular Movements: Extraocular movements intact.     Conjunctiva/sclera: Conjunctivae normal.     Pupils: Pupils are equal, round, and reactive to light.  Neck:     Thyroid: No thyroid mass or thyromegaly.  Cardiovascular:     Rate and Rhythm: Normal rate and regular rhythm.     Heart sounds: S1 normal and S2 normal. No murmur heard.  No friction rub. No S3 or S4 sounds.      Comments: No carotid bruits.  Carotid, radial, femoral, DP and PT pulses normal and equal.  Pulmonary:     Effort: Pulmonary effort is normal.     Breath sounds: Normal breath sounds.  Chest:     Breasts:        Right: No inverted nipple, mass or nipple discharge.        Left: No inverted nipple, mass or nipple discharge.  Abdominal:     General: Bowel sounds are normal.     Palpations: Abdomen is soft. There is no hepatomegaly, splenomegaly or mass.     Tenderness: There is no abdominal tenderness.     Hernia: No hernia is present.  Musculoskeletal:     Right shoulder: Tenderness (Over subacromial bursa and AC/CC joints.  Mildly on trap as well.) present. Decreased range of motion (Difficulty with abduction above 90 degrees.).     Cervical back: Full passive range of motion without pain, normal range of motion and neck supple.  Lymphadenopathy:     Head:  Right side of head: No submental or submandibular adenopathy.     Left side of head: No submental or submandibular adenopathy.     Cervical: No cervical adenopathy.     Upper Body:     Right upper body: No supraclavicular or axillary adenopathy.     Left upper body: No supraclavicular or axillary adenopathy.     Lower Body: No right inguinal adenopathy. No left inguinal adenopathy.  Skin:    General: Skin is warm.     Capillary Refill: Capillary refill takes less than 2  seconds.     Findings: Rash (Thickened, hyperpigmented areas on dorsal right foot and medial side of heel.) present.  Neurological:     Mental Status: She is alert and oriented to person, place, and time.     Cranial Nerves: Cranial nerves are intact.     Sensory: Sensation is intact.     Motor: Motor function is intact.     Coordination: Coordination is intact.     Gait: Gait is intact.     Deep Tendon Reflexes: Reflexes are normal and symmetric.  Psychiatric:        Attention and Perception: Attention normal.        Mood and Affect: Mood normal.        Behavior: Behavior normal.        Thought Content: Thought content normal.        Cognition and Memory: Cognition normal.        Judgment: Judgment normal.      Assessment & Plan  1.  CPE without pap Mammogram Send for screening colonoscopy with Dr. Ardis Hughs CBC, CMP, FLP, TSH  2.  Prediabetes/Obesity/hypertension/tobacco abuse:  Strongly urged patient to get started with small stepped goals for changing diet and increasing physical activity to get control of health.  Discussed working with her husband to do so as well. She is not ready to quit smoking.  We have tried Chantix in the past, but she did not quit or make a goal date to quit, just cut down on smoking. BP check in 6 weeks.  3.  Hypercholesterolemia:  As in #2.  She will also restart Rosuvastatin and let me know if any new musculoskeletal complaints.  FLP/hepatic profile in 6 weeks.    4.  Eczema:  To try Clobetasol and see if covered--twice daily to affected area as needed.

## 2020-07-03 LAB — COMPREHENSIVE METABOLIC PANEL
ALT: 11 IU/L (ref 0–32)
AST: 14 IU/L (ref 0–40)
Albumin/Globulin Ratio: 1.5 (ref 1.2–2.2)
Albumin: 4.3 g/dL (ref 3.8–4.8)
Alkaline Phosphatase: 102 IU/L (ref 44–121)
BUN/Creatinine Ratio: 10 — ABNORMAL LOW (ref 12–28)
BUN: 7 mg/dL — ABNORMAL LOW (ref 8–27)
Bilirubin Total: 0.4 mg/dL (ref 0.0–1.2)
CO2: 26 mmol/L (ref 20–29)
Calcium: 9 mg/dL (ref 8.7–10.3)
Chloride: 101 mmol/L (ref 96–106)
Creatinine, Ser: 0.72 mg/dL (ref 0.57–1.00)
GFR calc Af Amer: 104 mL/min/{1.73_m2} (ref >59)
GFR calc non Af Amer: 90 mL/min/{1.73_m2} (ref >59)
Globulin, Total: 2.8 g/dL (ref 1.5–4.5)
Glucose: 99 mg/dL (ref 65–99)
Potassium: 4.4 mmol/L (ref 3.5–5.2)
Sodium: 140 mmol/L (ref 134–144)
Total Protein: 7.1 g/dL (ref 6.0–8.5)

## 2020-07-03 LAB — CBC WITH DIFFERENTIAL/PLATELET
Basophils Absolute: 0.1 10*3/uL (ref 0.0–0.2)
Basos: 1 %
EOS (ABSOLUTE): 0.1 10*3/uL (ref 0.0–0.4)
Eos: 2 %
Hematocrit: 39.9 % (ref 34.0–46.6)
Hemoglobin: 13.3 g/dL (ref 11.1–15.9)
Immature Grans (Abs): 0 10*3/uL (ref 0.0–0.1)
Immature Granulocytes: 0 %
Lymphocytes Absolute: 2.8 10*3/uL (ref 0.7–3.1)
Lymphs: 43 %
MCH: 30.1 pg (ref 26.6–33.0)
MCHC: 33.3 g/dL (ref 31.5–35.7)
MCV: 90 fL (ref 79–97)
Monocytes Absolute: 0.6 10*3/uL (ref 0.1–0.9)
Monocytes: 9 %
Neutrophils Absolute: 3 10*3/uL (ref 1.4–7.0)
Neutrophils: 45 %
Platelets: 311 10*3/uL (ref 150–450)
RBC: 4.42 x10E6/uL (ref 3.77–5.28)
RDW: 13.5 % (ref 11.7–15.4)
WBC: 6.6 10*3/uL (ref 3.4–10.8)

## 2020-07-03 LAB — LIPID PANEL W/O CHOL/HDL RATIO
Cholesterol, Total: 253 mg/dL — ABNORMAL HIGH (ref 100–199)
HDL: 52 mg/dL (ref >39)
LDL Chol Calc (NIH): 191 mg/dL — ABNORMAL HIGH (ref 0–99)
Triglycerides: 61 mg/dL (ref 0–149)
VLDL Cholesterol Cal: 10 mg/dL (ref 5–40)

## 2020-07-03 LAB — HGB A1C W/O EAG: Hgb A1c MFr Bld: 6.8 % — ABNORMAL HIGH (ref 4.8–5.6)

## 2020-07-03 LAB — TSH: TSH: 1.54 u[IU]/mL (ref 0.450–4.500)

## 2020-07-16 ENCOUNTER — Ambulatory Visit
Admission: RE | Admit: 2020-07-16 | Discharge: 2020-07-16 | Disposition: A | Discharge status: Home / Self Care | Payer: 59 | Source: Ambulatory Visit | Attending: Internal Medicine | Admitting: Internal Medicine

## 2020-07-16 ENCOUNTER — Other Ambulatory Visit: Payer: Self-pay

## 2020-07-16 DIAGNOSIS — Z1231 Encounter for screening mammogram for malignant neoplasm of breast: Secondary | ICD-10-CM

## 2020-07-22 ENCOUNTER — Other Ambulatory Visit: Payer: Self-pay | Admitting: Internal Medicine

## 2020-07-22 ENCOUNTER — Telehealth: Payer: Self-pay

## 2020-07-22 DIAGNOSIS — R928 Other abnormal and inconclusive findings on diagnostic imaging of breast: Secondary | ICD-10-CM

## 2020-07-24 NOTE — Telephone Encounter (Signed)
Called and left message with patient that [facility] to get her set up with additional views and likely and ultrasound to further evaluate and to let us know if she does not hear anything.

## 2020-07-24 NOTE — Telephone Encounter (Signed)
Patient called back and shared she is scheduled for additional views next WEdnesday.

## 2020-07-25 ENCOUNTER — Encounter: Payer: Self-pay | Admitting: Gastroenterology

## 2020-07-31 ENCOUNTER — Other Ambulatory Visit: Payer: Self-pay | Admitting: Internal Medicine

## 2020-07-31 ENCOUNTER — Other Ambulatory Visit: Payer: Self-pay

## 2020-07-31 ENCOUNTER — Ambulatory Visit
Admission: RE | Admit: 2020-07-31 | Discharge: 2020-07-31 | Disposition: A | Discharge status: Home / Self Care | Payer: 59 | Source: Ambulatory Visit | Attending: Internal Medicine | Admitting: Internal Medicine

## 2020-07-31 DIAGNOSIS — R928 Other abnormal and inconclusive findings on diagnostic imaging of breast: Secondary | ICD-10-CM

## 2020-07-31 DIAGNOSIS — N6320 Unspecified lump in the left breast, unspecified quadrant: Secondary | ICD-10-CM

## 2020-08-13 ENCOUNTER — Other Ambulatory Visit: Payer: Self-pay

## 2020-08-13 VITALS — BP 136/80 | HR 70

## 2020-08-13 DIAGNOSIS — E7800 Pure hypercholesterolemia, unspecified: Secondary | ICD-10-CM

## 2020-08-14 ENCOUNTER — Ambulatory Visit: Payer: Self-pay | Admitting: Internal Medicine

## 2020-08-14 ENCOUNTER — Encounter: Payer: Self-pay | Admitting: Internal Medicine

## 2020-08-14 VITALS — BP 180/92 | HR 68 | Resp 20 | Ht 61.0 in | Wt 267.5 lb

## 2020-08-14 DIAGNOSIS — E119 Type 2 diabetes mellitus without complications: Secondary | ICD-10-CM

## 2020-08-14 DIAGNOSIS — I10 Essential (primary) hypertension: Secondary | ICD-10-CM

## 2020-08-14 DIAGNOSIS — E7800 Pure hypercholesterolemia, unspecified: Secondary | ICD-10-CM

## 2020-08-14 LAB — HEPATIC FUNCTION PANEL
ALT: 8 IU/L (ref 0–32)
AST: 13 IU/L (ref 0–40)
Albumin: 4.1 g/dL (ref 3.8–4.8)
Alkaline Phosphatase: 93 IU/L (ref 44–121)
Bilirubin Total: 0.4 mg/dL (ref 0.0–1.2)
Bilirubin, Direct: 0.12 mg/dL (ref 0.00–0.40)
Total Protein: 6.7 g/dL (ref 6.0–8.5)

## 2020-08-14 LAB — LIPID PANEL W/O CHOL/HDL RATIO
Cholesterol, Total: 162 mg/dL (ref 100–199)
HDL: 51 mg/dL (ref >39)
LDL Chol Calc (NIH): 99 mg/dL (ref 0–99)
Triglycerides: 60 mg/dL (ref 0–149)
VLDL Cholesterol Cal: 12 mg/dL (ref 5–40)

## 2020-08-14 MED ORDER — METFORMIN HCL ER 500 MG PO TB24: 500.0000 mg | ORAL_TABLET | Freq: Every day | ORAL | 11 refills | Status: DC

## 2020-08-14 NOTE — Progress Notes (Signed)
    Subjective:    Patient ID: Terri Powers, female   DOB: 1956-07-03, 64 y.o.   MRN: [id number]   HPI   1.  DM:  A1C at CPE in September up to diabetic range for first time at 6.8%.  She is willing to start medication and make lifestyle changes.  2.  Hypercholesterolemia  Total cholesterol from CPE in September dropped from 253 to 162 with check on 11/9 with restart of Rosuvastatin.  LDL dropped from 191 to 99.  Discussed a good drop, but not quite at goal.    3  Hypertension:  Anxious today with diagnosis of DM to discuss.  Taking meds.  Current Meds  Medication Sig  . acetaminophen (TYLENOL) 500 MG tablet Take 1,000 mg by mouth every 6 (six) hours as needed for mild pain.  Marland Kitchen albuterol (VENTOLIN HFA) 108 (90 Base) MCG/ACT inhaler 2 puffs every 6 hours as needed for wheezing  . amLODipine (NORVASC) 5 MG tablet Take 1 tablet by mouth once daily  . calcium citrate-vitamin D 500-400 MG-UNIT chewable tablet Chew 1 tablet by mouth 2 (two) times daily.  . cetirizine (ZYRTEC) 10 MG tablet Take 1 tablet (10 mg total) by mouth daily.  . clobetasol cream (TEMOVATE) 0.88 % Apply 1 application topically 2 (two) times daily.  . fluticasone (FLONASE) 50 MCG/ACT nasal spray Place 2 sprays into both nostrils daily.  Marland Kitchen ibuprofen (ADVIL,MOTRIN) 800 MG tablet Take 800 mg by mouth every 8 (eight) hours as needed. for pain  . metoprolol succinate (TOPROL XL) 100 MG 24 hr tablet Take 1 tablet (100 mg total) by mouth daily. Take with or immediately following a meal.  . montelukast (SINGULAIR) 10 MG tablet Take 1 tablet (10 mg total) by mouth at bedtime.  . Multiple Vitamin (MULTIVITAMIN) tablet Take 1 tablet by mouth daily.  . rosuvastatin (CRESTOR) 10 MG tablet 1 tab by mouth daily with evening meal  . [DISCONTINUED] betamethasone valerate ointment (VALISONE) 0.1 % Apply to affected areas twice daily as needed   Allergies  Allergen Reactions  . Lisinopril Cough    Side effect  . Peach Flavor  Hives  . Shellfish Allergy     Allergy to seafood     Review of Systems    Objective:   BP (!) 180/92   Pulse 68   Resp 20   Ht 5\' 1"  (1.549 m)   Wt 267 lb 8 oz (121.3 kg)   BMI 50.54 kg/m   Physical Exam  Anxious Lungs:  CTA CV:  RRR without murmur or rub.  Radial pulses normal and equal LE:  No edema   Assessment & Plan  1.  New onset DM:  Start Metformin ER once daily for 1 week and then bid.  Encouraged working on lifestyle changes for weight loss as well.  2.  Hypercholesterolemia:  FLP much improved on Rosuvastatin restart.  Encouraged lifestyle changes.  3.  Hypertension:  Repeat BP check in 1 week when just in for that.

## 2020-08-14 NOTE — Patient Instructions (Signed)

## 2020-08-20 ENCOUNTER — Ambulatory Visit: Payer: Self-pay | Admitting: Internal Medicine

## 2020-08-20 VITALS — BP 138/80 | HR 74

## 2020-08-20 DIAGNOSIS — I10 Essential (primary) hypertension: Secondary | ICD-10-CM

## 2020-09-13 ENCOUNTER — Other Ambulatory Visit: Payer: Self-pay | Admitting: Internal Medicine

## 2020-09-17 ENCOUNTER — Other Ambulatory Visit: Payer: Self-pay

## 2020-09-17 ENCOUNTER — Ambulatory Visit (AMBULATORY_SURGERY_CENTER): Payer: Self-pay | Admitting: *Deleted

## 2020-09-17 VITALS — Ht 61.81 in | Wt 269.0 lb

## 2020-09-17 DIAGNOSIS — Z1211 Encounter for screening for malignant neoplasm of colon: Secondary | ICD-10-CM

## 2020-09-17 MED ORDER — NA SULFATE-K SULFATE-MG SULF 17.5-3.13-1.6 GM/177ML PO SOLN: 1.0000 | Freq: Once | ORAL | 0 refills | Status: AC

## 2020-09-17 NOTE — Progress Notes (Signed)
No egg or soy allergy known to patient  No issues with past sedation with any surgeries or procedures No intubation problems in the past  No FH of Malignant Hyperthermia No diet pills per patient No home 02 use per patient  No blood thinners per patient  Pt denies issues with constipation  No A fib or A flutter  EMMI video to pt or via [place] 19 guidelines implemented in PV today with Pt and RN  Pt is fully vaccinated  for Covid   Pt weighed and height measured.  Pt is 269.0 lbs and 157 cm = BMI 49.50  Due to the COVID-19 pandemic we are asking patients to follow certain guidelines.  Pt aware of COVID protocols and [facility] guidelines

## 2020-10-01 ENCOUNTER — Encounter: Payer: Self-pay | Admitting: Gastroenterology

## 2020-10-01 ENCOUNTER — Other Ambulatory Visit: Payer: Self-pay

## 2020-10-01 ENCOUNTER — Ambulatory Visit (AMBULATORY_SURGERY_CENTER): Payer: 59 | Admitting: Gastroenterology

## 2020-10-01 VITALS — BP 116/60 | HR 61 | Temp 96.8°F | Resp 19 | Ht 61.75 in | Wt 269.0 lb

## 2020-10-01 DIAGNOSIS — K621 Rectal polyp: Secondary | ICD-10-CM | POA: Diagnosis not present

## 2020-10-01 DIAGNOSIS — D123 Benign neoplasm of transverse colon: Secondary | ICD-10-CM

## 2020-10-01 DIAGNOSIS — Z1211 Encounter for screening for malignant neoplasm of colon: Secondary | ICD-10-CM | POA: Diagnosis present

## 2020-10-01 DIAGNOSIS — D128 Benign neoplasm of rectum: Secondary | ICD-10-CM

## 2020-10-01 MED ORDER — SODIUM CHLORIDE 0.9 % IV SOLN: 500.0000 mL | Freq: Once | INTRAVENOUS | Status: DC

## 2020-10-01 NOTE — Progress Notes (Signed)
Called to room to assist during endoscopic procedure.  Patient ID and intended procedure confirmed with present staff. Received instructions for my participation in the procedure from the performing physician.  

## 2020-10-01 NOTE — Progress Notes (Signed)
To PACU, VSS. Report to [email or web address] 

## 2020-10-01 NOTE — Op Note (Signed)
[facility] Patient Name: Terri Powers Procedure Date: 10/01/2020 9:04 AM MRN: [id number] Endoscopist: Milus Banister , MD Age: 64 Referring MD:  Date of Birth: 1956/10/04 Gender: Female Account #: [id number] Procedure:                Colonoscopy Indications:              Screening for colorectal malignant neoplasm Medicines:                Monitored Anesthesia Care Procedure:                Pre-Anesthesia Assessment:                           - Prior to the procedure, a History and Physical                            was performed, and patient medications and                            allergies were reviewed. The patient's tolerance of                            previous anesthesia was also reviewed. The risks                            and benefits of the procedure and the sedation                            options and risks were discussed with the patient.                            All questions were answered, and informed consent                            was obtained. Prior Anticoagulants: The patient has                            taken no previous anticoagulant or antiplatelet                            agents. ASA Grade Assessment: III - A patient with                            severe systemic disease. After reviewing the risks                            and benefits, the patient was deemed in                            satisfactory condition to undergo the procedure.                           After obtaining informed consent, the colonoscope  was passed under direct vision. Throughout the                            procedure, the patient's blood pressure, pulse, and                            oxygen saturations were monitored continuously. The                            Olympus CF-HQ190L [id number]) Colonoscope was                            introduced through the anus and advanced to the the                            cecum,  identified by appendiceal orifice and                            ileocecal valve. The colonoscopy was performed                            without difficulty. The patient tolerated the                            procedure well. The quality of the bowel                            preparation was good. The ileocecal valve,                            appendiceal orifice, and rectum were photographed. Scope In: 9:10:13 AM Scope Out: 9:26:36 AM Scope Withdrawal Time: 0 hours 14 minutes 14 seconds  Total Procedure Duration: 0 hours 16 minutes 23 seconds  Findings:                 Two sessile polyps were found in the rectum and                            transverse colon. The polyps were 2 to 4 mm in                            size. These polyps were removed with a cold snare.                            Resection and retrieval were complete.                           The exam was otherwise without abnormality on                            direct and retroflexion views. Complications:            No immediate complications. Estimated blood loss:  None. Estimated Blood Loss:     Estimated blood loss: none. Impression:               - Two 2 to 4 mm polyps in the rectum and in the                            transverse colon, removed with a cold snare.                            Resected and retrieved.                           - The examination was otherwise normal on direct                            and retroflexion views. Recommendation:           - Patient has a contact number available for                            emergencies. The signs and symptoms of potential                            delayed complications were discussed with the                            patient. Return to normal activities tomorrow.                            Written discharge instructions were provided to the                            patient.                           - Resume previous  diet.                           - Continue present medications.                           - Await pathology results. Rachael Fee, MD 10/01/2020 9:29:06 AM This report has been signed electronically.

## 2020-10-01 NOTE — Patient Instructions (Signed)

## 2020-10-01 NOTE — Progress Notes (Signed)
AR- Check-in  FD - VS  Pt's states no medical or surgical changes since previsit or office visit.

## 2020-10-02 ENCOUNTER — Telehealth: Payer: Self-pay

## 2020-10-02 NOTE — Telephone Encounter (Signed)
  Follow up Call-  Call back number 10/01/2020  Post procedure Call Back phone  # #[phone] cell  Permission to leave phone message Yes  Some recent data might be hidden     Patient questions:  Do you have a fever, pain , or abdominal swelling? No. Pain Score  0 *  Have you tolerated food without any problems? Yes.    Have you been able to return to your normal activities? Yes.    Do you have any questions about your discharge instructions: Diet   No. Medications  No. Follow up visit  No.  Do you have questions or concerns about your Care? No.  Actions: * If pain score is 4 or above: No action needed, pain <4.

## 2020-10-08 ENCOUNTER — Encounter: Payer: Self-pay | Admitting: Gastroenterology

## 2020-11-01 ENCOUNTER — Ambulatory Visit: Payer: BLUE CROSS/BLUE SHIELD | Admitting: Internal Medicine

## 2020-11-01 ENCOUNTER — Other Ambulatory Visit: Payer: Self-pay

## 2020-11-01 VITALS — BP 140/86 | HR 72 | Resp 13 | Ht 61.0 in | Wt 265.0 lb

## 2020-11-01 DIAGNOSIS — E119 Type 2 diabetes mellitus without complications: Secondary | ICD-10-CM

## 2020-11-01 DIAGNOSIS — N3941 Urge incontinence: Secondary | ICD-10-CM

## 2020-11-01 DIAGNOSIS — I10 Essential (primary) hypertension: Secondary | ICD-10-CM | POA: Diagnosis not present

## 2020-11-01 DIAGNOSIS — E7800 Pure hypercholesterolemia, unspecified: Secondary | ICD-10-CM | POA: Diagnosis not present

## 2020-11-01 DIAGNOSIS — Z6841 Body Mass Index (BMI) 40.0 and over, adult: Secondary | ICD-10-CM

## 2020-11-01 MED ORDER — OXYBUTYNIN CHLORIDE ER 5 MG PO TB24: 5.0000 mg | ORAL_TABLET | Freq: Every day | ORAL | 11 refills | Status: DC

## 2020-11-01 NOTE — Progress Notes (Signed)
Subjective:    Patient ID: Terri Powers, female   DOB: [birth year], 65 y.o.   MRN: [id number]   HPI   1.  Obesity with new onset DM:  Has lost about 4 lbs over about 1 month.  Tolerating the Metformin fine. She will be changing insurance coverage next month.  Will wait until has that before sending in for glucometer and test strips. Has weaned down to 1 can of [facility].    2.  GI:  Had her colonoscopy:  2 adenomatous polyps, 1 in transverse colon and other in rectum, both without high grade dysplasia-- to go back in 5 years for repeat.  Dr. Ardis Hughs.  3.  Hypertension:  Did not take her bp meds on time today--was about 2 hours late before taking.  At home, wrist cuff digital monitor registers 130/60s.    4.  Urinary Stress urge incontinence:  Has had for 1 year.  Has to wear protection now.  Up twice at night to urinate.  Not always a lot.    Current Meds  Medication Sig  . acetaminophen (TYLENOL) 500 MG tablet Take 1,000 mg by mouth every 6 (six) hours as needed for mild pain.  Marland Kitchen albuterol (VENTOLIN HFA) 108 (90 Base) MCG/ACT inhaler 2 puffs every 6 hours as needed for wheezing  . amLODipine (NORVASC) 5 MG tablet Take 1 tablet by mouth once daily  . calcium citrate-vitamin D 500-400 MG-UNIT chewable tablet Chew 1 tablet by mouth 2 (two) times daily.  . cetirizine (ZYRTEC) 10 MG tablet Take 1 tablet by mouth once daily  . clobetasol cream (TEMOVATE) 2.02 % Apply 1 application topically 2 (two) times daily.  . fluticasone (FLONASE) 50 MCG/ACT nasal spray Place 2 sprays into both nostrils daily.  Marland Kitchen ibuprofen (ADVIL,MOTRIN) 800 MG tablet Take 800 mg by mouth every 8 (eight) hours as needed. for pain  . lansoprazole (PREVACID) 15 MG capsule Take 15 mg by mouth daily at 12 noon.  . metFORMIN (GLUCOPHAGE-XR) 500 MG 24 hr tablet Take 1 tablet (500 mg total) by mouth daily with breakfast.  . metoprolol succinate (TOPROL XL) 100 MG 24 hr tablet Take 1 tablet (100 mg  total) by mouth daily. Take with or immediately following a meal.  . montelukast (SINGULAIR) 10 MG tablet TAKE 1 TABLET BY MOUTH AT BEDTIME  . rosuvastatin (CRESTOR) 10 MG tablet 1 tab by mouth daily with evening meal   Allergies  Allergen Reactions  . Lisinopril Cough    Side effect  . Peach Flavor Hives  . Shellfish Allergy     Allergy to seafood     Review of Systems    Objective:   BP 140/86 (BP Location: Left Arm, Patient Position: Sitting, Cuff Size: Large)   Pulse 72   Resp 13   Ht 5\' 1"  (1.549 m)   Wt 265 lb (120.2 kg)   BMI 50.07 kg/m   Physical Exam  NAD Lungs:  CTA CV:  RRR without murmur or rub.  Radial pulses normal and equal LE:  No edema   Assessment & Plan  1.  DM:  Tolerating metformin fine.  A1C.  Encouraged continued lifestyle changes for weight loss.  Once has new insurance, will order glucose monitoring supplies.  2.  Hypertension:  A bit high today, but late on meds.   Bring in bp cuff in 2 weeks for bp check for comparison and reliability of home monitor.  3.  Mixed urinary incontinence:  Try  Oxybutynin 5 mg daily.  Discussed dry mouth side effect.  4.  HM:  Adenomatous polyps without dysplasia.  Repeat colonoscopy in 5 years with Dr. Ardis Hughs  5.  Hypercholesterolemia:  Repeat FLP in 4 months with A1C and OV with me 2d later

## 2020-11-02 LAB — HGB A1C W/O EAG: Hgb A1c MFr Bld: 6.6 % — ABNORMAL HIGH (ref 4.8–5.6)

## 2020-12-01 NOTE — Progress Notes (Signed)
BP fine today.

## 2020-12-02 ENCOUNTER — Other Ambulatory Visit: Payer: Self-pay | Admitting: Internal Medicine

## 2020-12-02 ENCOUNTER — Other Ambulatory Visit: Payer: Self-pay

## 2020-12-02 VITALS — BP 150/78 | HR 58

## 2020-12-02 DIAGNOSIS — I10 Essential (primary) hypertension: Secondary | ICD-10-CM | POA: Diagnosis not present

## 2020-12-02 NOTE — Progress Notes (Signed)
First BP with wrist home monitor SEcond with our manual cuff. Relatively similar BPs today.  Her BPs at home are running in the 130s over 70s.

## 2020-12-09 ENCOUNTER — Telehealth: Payer: Self-pay | Admitting: Internal Medicine

## 2020-12-09 NOTE — Telephone Encounter (Signed)
Patient called asking for an appointment ro a medicine recommendation for a dry cough, chest congestion that she's been having for the past 3 days. Patient stated that she is concern due to her history of bronchitis. Please advise.

## 2020-12-09 NOTE — Telephone Encounter (Signed)
Please see if she is having and sense of shortness of breath from her chest/or tightness like she cannot get a deep breath. Fever, Chills? No drainage down her throat? No nasal or sinus congestion? Sneezing, eye watering or itching? Anyone else with similar symptoms around her?  Has she tried an antihistamine with the pollen increasing?  Claritin, Cetirizine, Xyzal, Allegra?   Also, please confirm/document that she did perform a home COVID test.

## 2020-12-10 NOTE — Telephone Encounter (Signed)
Appointment for patient was schedule for 12/11/2020 @ 4:PM

## 2020-12-10 NOTE — Telephone Encounter (Signed)
No shortness of breath, fever or chills, and she does have some drainage down her throat and sinus congestion Also, Swollen eyes, but not itching.  No one else at home have symptoms and patient tested negative for COVID.  Patient has been taking  Cetirizine, Montelukast as prescribes but is not helping significantly.

## 2020-12-10 NOTE — Telephone Encounter (Signed)
Spoke with patient:  clear mucous.  Using nasal fluticasone, Cetirizine and Montelukast regularly.   She does have DM, so would like to avoid prednisone.  Asked her to switch from Cetirizine to generic Allegra 180 mg daily. Appt with me tomorrow at 4 p.m.--please schedule and she is aware.

## 2020-12-11 ENCOUNTER — Other Ambulatory Visit: Payer: Self-pay

## 2020-12-11 ENCOUNTER — Encounter: Payer: Self-pay | Admitting: Internal Medicine

## 2020-12-11 ENCOUNTER — Ambulatory Visit (INDEPENDENT_AMBULATORY_CARE_PROVIDER_SITE_OTHER): Payer: 59 | Admitting: Internal Medicine

## 2020-12-11 ENCOUNTER — Other Ambulatory Visit: Payer: Self-pay | Admitting: Internal Medicine

## 2020-12-11 VITALS — BP 140/80 | HR 64 | Resp 18 | Ht 61.0 in | Wt 271.0 lb

## 2020-12-11 DIAGNOSIS — J9801 Acute bronchospasm: Secondary | ICD-10-CM

## 2020-12-11 DIAGNOSIS — E119 Type 2 diabetes mellitus without complications: Secondary | ICD-10-CM

## 2020-12-11 DIAGNOSIS — J3089 Other allergic rhinitis: Secondary | ICD-10-CM

## 2020-12-11 DIAGNOSIS — F17200 Nicotine dependence, unspecified, uncomplicated: Secondary | ICD-10-CM

## 2020-12-11 MED ORDER — PREDNISONE 20 MG PO TABS: ORAL_TABLET | ORAL | 0 refills | Status: DC

## 2020-12-11 MED ORDER — ACCU-CHEK AVIVA PLUS W/DEVICE KIT: PACK | 0 refills | Status: DC

## 2020-12-11 MED ORDER — FLOVENT HFA 110 MCG/ACT IN AERO: 2.0000 | INHALATION_SPRAY | Freq: Two times a day (BID) | RESPIRATORY_TRACT | 12 refills | Status: DC

## 2020-12-11 MED ORDER — ACCU-CHEK AVIVA PLUS VI STRP: ORAL_STRIP | 11 refills | Status: DC

## 2020-12-11 MED ORDER — ACCU-CHEK MULTICLIX LANCETS MISC: 11 refills | Status: DC

## 2020-12-11 MED ORDER — LEVOCETIRIZINE DIHYDROCHLORIDE 5 MG PO TABS
5.0000 mg | ORAL_TABLET | Freq: Every evening | ORAL | 11 refills | Status: DC
Start: 2020-12-11 — End: 2021-10-24

## 2020-12-11 NOTE — Patient Instructions (Addendum)
STOP SMOKING SO YOU CAN BREATHE!!!!!!!!!!!!!  Call progress report at end of week please.

## 2020-12-11 NOTE — Progress Notes (Signed)
      Subjective:    Patient ID: Terri Powers, female   DOB: 1956-08-22, 65 y.o.   MRN: [id number]   HPI   Posterior pharyngeal drainage, cough, swollen eyes, sneezing. Itchy eyes, nose and throat as well as ears since the past weekend when she was outdoors and the weather was warming up.  Lots of blooming going on.   Has continued all of her allergy meds and using Albuterol 3 times daily, which helps.  Yesterday, switched to Xyzal from Cetirizine with improvement in posterior pharyngeal drainage.  Did have windows open all weekend.   Current Meds  Medication Sig  . acetaminophen (TYLENOL) 500 MG tablet Take 1,000 mg by mouth every 6 (six) hours as needed for mild pain.  Marland Kitchen albuterol (VENTOLIN HFA) 108 (90 Base) MCG/ACT inhaler 2 puffs every 6 hours as needed for wheezing  . amLODipine (NORVASC) 5 MG tablet Take 1 tablet by mouth once daily  . calcium citrate-vitamin D 500-400 MG-UNIT chewable tablet Chew 1 tablet by mouth 2 (two) times daily.  . clobetasol cream (TEMOVATE) 2.25 % Apply 1 application topically 2 (two) times daily.  . fluticasone (FLONASE) 50 MCG/ACT nasal spray Place 2 sprays into both nostrils daily.  . lansoprazole (PREVACID) 15 MG capsule Take 15 mg by mouth daily at 12 noon.  Marland Kitchen levocetirizine (XYZAL) 5 MG tablet Take 5 mg by mouth every evening.  . metFORMIN (GLUCOPHAGE-XR) 500 MG 24 hr tablet Take 1 tablet (500 mg total) by mouth daily with breakfast.  . metoprolol succinate (TOPROL XL) 100 MG 24 hr tablet Take 1 tablet (100 mg total) by mouth daily. Take with or immediately following a meal.  . montelukast (SINGULAIR) 10 MG tablet TAKE 1 TABLET BY MOUTH AT BEDTIME  . oxybutynin (DITROPAN XL) 5 MG 24 hr tablet Take 1 tablet (5 mg total) by mouth at bedtime.  . rosuvastatin (CRESTOR) 10 MG tablet 1 tab by mouth daily with evening meal   Allergies  Allergen Reactions  . Lisinopril Cough    Side effect  . Peach Flavor Hives  . Shellfish Allergy      Allergy to seafood     Review of Systems    Objective:   BP 140/80 (BP Location: Right Arm, Patient Position: Sitting, Cuff Size: Large)   Pulse 64   Resp 18   Ht 5\' 1"  (1.549 m)   Wt 271 lb (122.9 kg)   BMI 51.21 kg/m   Physical Exam  NAD, though congested cough HEENT:  PERRL, EOMI, conjunctivae without injection, but eyes watering a bit.  Nasal mucosa boggy with clear discharge.  Mild cobbling of posterior pharynx. Neck:  Supple, No adenopathy Chest:  Scattered wheeze--glottic wheeze noise/upper airway as well.  No crackles. CV:  RRR without murmur or rub.  Radial pulses normal and equal LE:  No edema.   Assessment & Plan  1.  Allergies and allergic asthma exacerbation:  Rx for Xyzal 5 mg daily.  Prednisone 20 mg daily for 5 days and inhaled corticosteroid with Flovent 110 mcg 2 puffs twice daily.  2.  DM:  Glucose monitoring equipment written with start of prednisone burst to be certain she is not getting out of  control   3.  Tobacco abuse:  Encouraged this is a good time to stop smoking with her wheezing.  Not clear she will work on this.  Encouraged nicotine gum or lozenges to help.

## 2020-12-16 MED ORDER — ACCU-CHEK GUIDE W/DEVICE KIT: PACK | 0 refills | Status: DC

## 2020-12-16 MED ORDER — ACCU-CHEK GUIDE VI STRP: ORAL_STRIP | 11 refills | Status: DC

## 2021-02-04 ENCOUNTER — Other Ambulatory Visit: Payer: Self-pay

## 2021-02-04 ENCOUNTER — Other Ambulatory Visit: Payer: Self-pay | Admitting: Internal Medicine

## 2021-02-04 ENCOUNTER — Ambulatory Visit
Admission: RE | Admit: 2021-02-04 | Discharge: 2021-02-04 | Disposition: A | Discharge status: Home / Self Care | Payer: 59 | Source: Ambulatory Visit | Attending: Internal Medicine | Admitting: Internal Medicine

## 2021-02-04 DIAGNOSIS — N6320 Unspecified lump in the left breast, unspecified quadrant: Secondary | ICD-10-CM

## 2021-03-08 ENCOUNTER — Other Ambulatory Visit: Payer: Self-pay | Admitting: Internal Medicine

## 2021-03-10 ENCOUNTER — Other Ambulatory Visit: Payer: Self-pay

## 2021-03-10 ENCOUNTER — Other Ambulatory Visit (INDEPENDENT_AMBULATORY_CARE_PROVIDER_SITE_OTHER): Payer: 59 | Admitting: Internal Medicine

## 2021-03-10 DIAGNOSIS — Z1159 Encounter for screening for other viral diseases: Secondary | ICD-10-CM | POA: Diagnosis not present

## 2021-03-10 DIAGNOSIS — Z114 Encounter for screening for human immunodeficiency virus [HIV]: Secondary | ICD-10-CM

## 2021-03-10 DIAGNOSIS — E119 Type 2 diabetes mellitus without complications: Secondary | ICD-10-CM

## 2021-03-10 DIAGNOSIS — E7800 Pure hypercholesterolemia, unspecified: Secondary | ICD-10-CM

## 2021-03-12 ENCOUNTER — Ambulatory Visit (INDEPENDENT_AMBULATORY_CARE_PROVIDER_SITE_OTHER): Payer: 59 | Admitting: Internal Medicine

## 2021-03-12 ENCOUNTER — Other Ambulatory Visit: Payer: Self-pay

## 2021-03-12 ENCOUNTER — Encounter: Payer: Self-pay | Admitting: Internal Medicine

## 2021-03-12 VITALS — BP 150/75 | HR 72 | Resp 24 | Ht 61.0 in | Wt 272.0 lb

## 2021-03-12 DIAGNOSIS — E119 Type 2 diabetes mellitus without complications: Secondary | ICD-10-CM | POA: Diagnosis not present

## 2021-03-12 DIAGNOSIS — E7800 Pure hypercholesterolemia, unspecified: Secondary | ICD-10-CM

## 2021-03-12 DIAGNOSIS — I10 Essential (primary) hypertension: Secondary | ICD-10-CM

## 2021-03-12 DIAGNOSIS — Z6841 Body Mass Index (BMI) 40.0 and over, adult: Secondary | ICD-10-CM

## 2021-03-12 DIAGNOSIS — N3946 Mixed incontinence: Secondary | ICD-10-CM

## 2021-03-12 LAB — LIPID PANEL W/O CHOL/HDL RATIO
Cholesterol, Total: 177 mg/dL (ref 100–199)
HDL: 49 mg/dL (ref >39)
LDL Chol Calc (NIH): 117 mg/dL — ABNORMAL HIGH (ref 0–99)
Triglycerides: 54 mg/dL (ref 0–149)
VLDL Cholesterol Cal: 11 mg/dL (ref 5–40)

## 2021-03-12 LAB — HIV ANTIBODY (ROUTINE TESTING W REFLEX): HIV Screen 4th Generation wRfx: NONREACTIVE

## 2021-03-12 LAB — HEPATITIS C ANTIBODY: Hep C Virus Ab: <0.1 s/co ratio (ref 0.0–0.9)

## 2021-03-12 LAB — MICROALBUMIN / CREATININE URINE RATIO
Creatinine, Urine: 364.5 mg/dL
Microalb/Creat Ratio: 5 mg/g creat (ref 0–29)
Microalbumin, Urine: 20 ug/mL

## 2021-03-12 LAB — HGB A1C W/O EAG: Hgb A1c MFr Bld: 6.7 % — ABNORMAL HIGH (ref 4.8–5.6)

## 2021-03-12 MED ORDER — ROSUVASTATIN CALCIUM 20 MG PO TABS: ORAL_TABLET | ORAL | 11 refills | Status: DC

## 2021-03-12 MED ORDER — METFORMIN HCL ER 500 MG PO TB24: ORAL_TABLET | ORAL | 11 refills | Status: DC

## 2021-03-12 MED ORDER — BLOOD GLUCOSE MONITOR KIT: PACK | 0 refills | Status: DC

## 2021-03-12 NOTE — Patient Instructions (Signed)
[facility])  Addiction treatment center in [place], [place] Address: [address], [place], [place] [address] Hours:  Open 24 hours Phone: [phone]  [facility]  Mental health clinic in [place], [place] Address: [address], [place], [place] [address] Hours:  Open 24 hours Phone: [phone] They can refer to [facility] in [place] in [place], [place] COVID-19 info: ncdhhs.gov Address: [place], [place] [address] Hours:  Open 24 hours Phone: [phone]  [facility]  Addiction treatment center in [place], [place] Address: [address], [place], [place] [address] Departments: [place] Hours:  Open ? Closes 4:30PM Updated by phone call 4 days ago Phone: [phone]  [facility] in [place], [place] COVID-19 info: Conservation officer, [email or web address] Address: [address], [place], [place] [address] Hours:  Open 24 hours Health & safety: Mask required  Temperature check required  Staff wear masks  Staff get temperature checks  Staff required to disinfect surfaces between visits  More details Phone: [phone]  [facility]  Addiction treatment center in [place], [place] COVID-19 info: [email or web address] Address: [address] [place], [place] [address] Hours:  Open 24 hours Phone: [phone] Ask about scholarship availability Ask for Lance Bosch about scholarship

## 2021-03-12 NOTE — Progress Notes (Signed)
Subjective:    Patient ID: Terri Powers, female   DOB: Feb 21, 1956, 65 y.o.   MRN: [id number]   HPI   1.  DM:  A1C up just a bit, but still at a good level at 6.7%.  She is only taking Metformin ER 500 mg once daily.  Willing to increase to 1000 mg daily in morning. Still drinking coffee all day with sweetened creamers and drinking [facility] every other day. Gummi bears and pie in the evening Is trying to get out to parks regularly.  2.  Hypercholesterolemia:  LDL back up--still taking Rosuvastatin.   Lipid Panel     Component Value Date/Time   CHOL 177 03/10/2021 0943   TRIG 54 03/10/2021 0943   HDL 49 03/10/2021 0943   CHOLHDL 3.6 08/24/2016 0904   LDLCALC 117 (H) 03/10/2021 0943   LABVLDL 11 03/10/2021 0943   3.  Hypertension:  states not missing meds.  4.  Stressors with son who is struggling with addiction.  She is interested in finding options for him for treatment.    Current Meds  Medication Sig   albuterol (VENTOLIN HFA) 108 (90 Base) MCG/ACT inhaler 2 puffs every 6 hours as needed for wheezing   amLODipine (NORVASC) 5 MG tablet Take 1 tablet by mouth once daily   blood glucose meter kit and supplies KIT Dispense based on patient and insurance preference. Check blood glucose once daily before breakfast or dinner   calcium citrate-vitamin D 500-400 MG-UNIT chewable tablet Chew 1 tablet by mouth 2 (two) times daily.   clobetasol cream (TEMOVATE) 6.83 % Apply 1 application topically 2 (two) times daily.   fluticasone (FLONASE) 50 MCG/ACT nasal spray Place 2 sprays into both nostrils daily.   fluticasone (FLOVENT HFA) 110 MCG/ACT inhaler Inhale 2 puffs into the lungs 2 (two) times daily.   ibuprofen (ADVIL,MOTRIN) 800 MG tablet Take 800 mg by mouth every 8 (eight) hours as needed. for pain   lansoprazole (PREVACID) 15 MG capsule Take 15 mg by mouth daily at 12 noon.   levocetirizine (XYZAL) 5 MG tablet Take 1 tablet (5 mg total) by mouth every evening.    metFORMIN (GLUCOPHAGE-XR) 500 MG 24 hr tablet Take 1 tablet (500 mg total) by mouth daily with breakfast.   metoprolol succinate (TOPROL XL) 100 MG 24 hr tablet Take 1 tablet (100 mg total) by mouth daily. Take with or immediately following a meal.   montelukast (SINGULAIR) 10 MG tablet TAKE 1 TABLET BY MOUTH AT BEDTIME   oxybutynin (DITROPAN XL) 5 MG 24 hr tablet Take 1 tablet (5 mg total) by mouth at bedtime.   rosuvastatin (CRESTOR) 10 MG tablet TAKE 1 TABLET BY MOUTH  DAILY WITH EVENING MEAL   Allergies  Allergen Reactions   Lisinopril Cough    Side effect   Peach Flavor Hives   Shellfish Allergy     Allergy to seafood     Review of Systems    Objective:   BP (!) 150/75 (BP Location: Left Arm, Patient Position: Sitting, Cuff Size: Large)   Pulse 72   Resp (!) 24   Ht 5' 1"  (1.549 m)   Wt 272 lb (123.4 kg)   BMI 51.39 kg/m   Physical Exam NAD HEENT:  PERRL, EOMI Neck:  Supple, No adenopathy Chest:  CTA CV:  RRR without murmur or rub.  Radial and DP pulses normal and equal.  LE:  No edema   Assessment & Plan   DM/obesity:  extended discussion regarding understanding she needs to make changes in lifestyle to improve her diabetic, cholesterol and blood pressure control.  Encouraged her to make small incremental weekly goals and stick to them.  Make them easily doable.  Consider starting with discontinuation of soda.  Consider ileana for food addiction Increase Metformin XR to 1000 mg twice daily. Monitor glucose to get a better idea of what foods increase her blood sugars and what activities work well to control BS.  2.  Hypercholesterolemia:  increase Rosuvastatin to 20 mg daily.  Fasting labs in 3 months.  3.  Mixed urinary urge and stress incontinence:  Improved with oxybutynin.  Encouraged Kegels.  4.  HM:  HIV/Hep C screening negative.

## 2021-03-21 ENCOUNTER — Telehealth: Payer: Self-pay | Admitting: Internal Medicine

## 2021-03-21 NOTE — Telephone Encounter (Signed)
Patient called requesting an advise. Patient stated that her and her husband just came back from vacation and her husband tested positive for COVID, patient's test was negative.   Per Dr. Amil Amen: Husband needs to isolate from her, and their son. He needs to use a separate bedroom and bathroom. He need to wear a mask when using share areas and wipe everything he touched including light switches, etc.  Confirm he is vaccinated and boosted.  He needs to stay home and masked for 5 days if is boosted.  May go out in public with mask in 5 days as long as no fever for 24 hours and improving with symptoms.    He also needs to check in with his primary as there are antivirals available that make a difference for most, especially if he has risk factors for worse disease.    For Quenisha, she needs to mask around their son and avoid being around him as much as she can.   She should recheck Covid testing in 5 days or test if becomes symptomatic.  We can perform the testing.

## 2021-03-23 ENCOUNTER — Telehealth: Payer: Self-pay | Admitting: Internal Medicine

## 2021-03-23 DIAGNOSIS — U071 COVID-19: Secondary | ICD-10-CM

## 2021-03-23 MED ORDER — NIRMATRELVIR/RITONAVIR (PAXLOVID)TABLET: 3.0000 | ORAL_TABLET | Freq: Two times a day (BID) | ORAL | 0 refills | Status: AC

## 2021-03-23 NOTE — Telephone Encounter (Signed)
Hold Crestor when taking Paxlovid. Call if no improvement with Paxlovid

## 2021-03-26 NOTE — Telephone Encounter (Signed)
Patient spoke with Dr. Amil Amen ans was aware of all recommendations.

## 2021-04-03 IMAGING — US US BREAST*L* LIMITED INC AXILLA
1 series · 6 of 6 positions shown · non-contrast
Comparison: Previous exam(s).

ACR Breast Density Category a: The breast tissue is almost entirely
fatty.

CLINICAL DATA: 64-year-old female recalled from screening mammogram
dated 07/16/2020 for a possible left breast mass.

EXAM:
DIGITAL DIAGNOSTIC LEFT MAMMOGRAM WITH CAD AND TOMO
ULTRASOUND LEFT BREAST

[Series 1: us breast*left* limited inc axilla · 0.11mm/px · 6 of 6 slices shown]
[im 1/6]
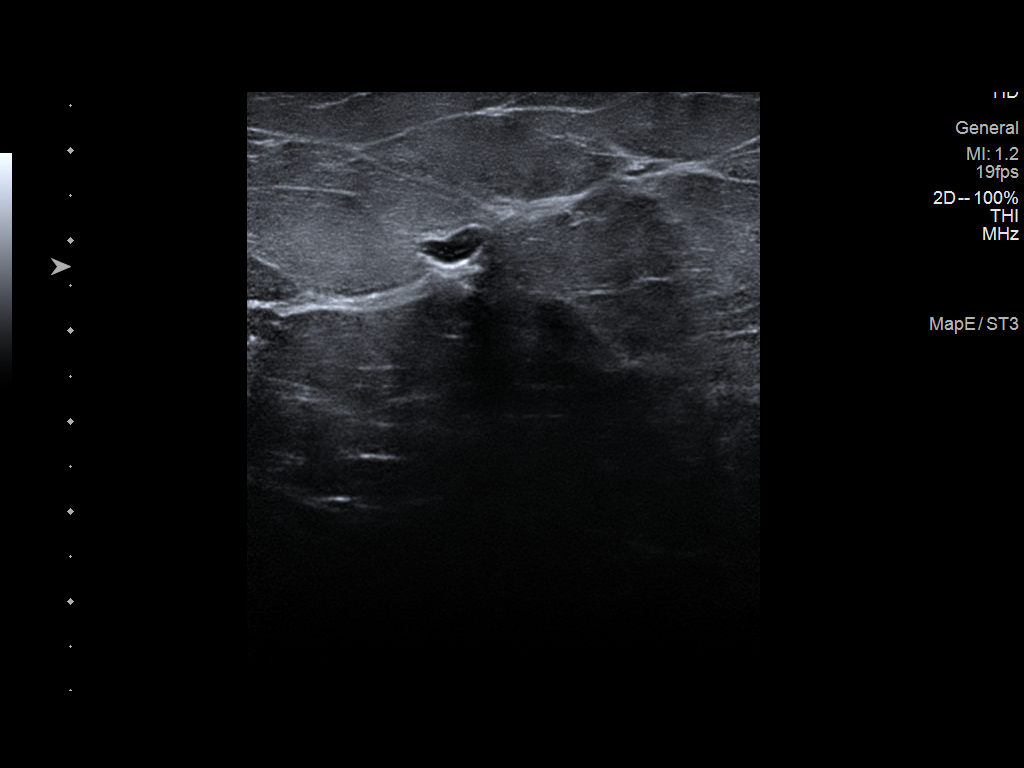
[im 2/6]
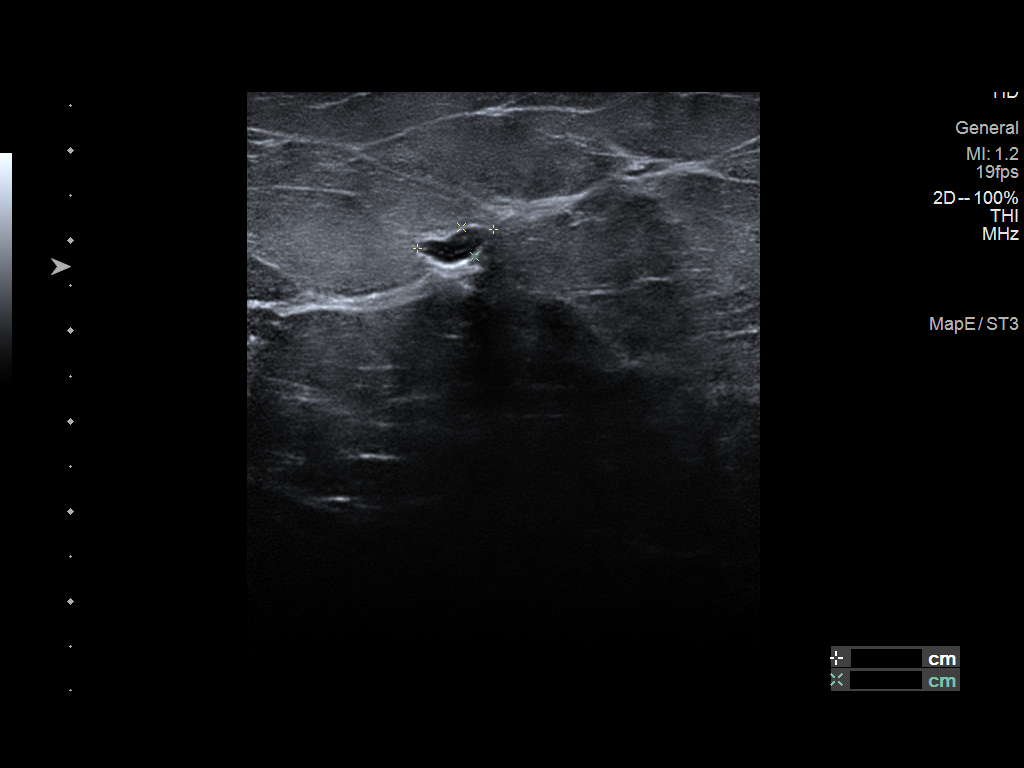
[im 3/6]
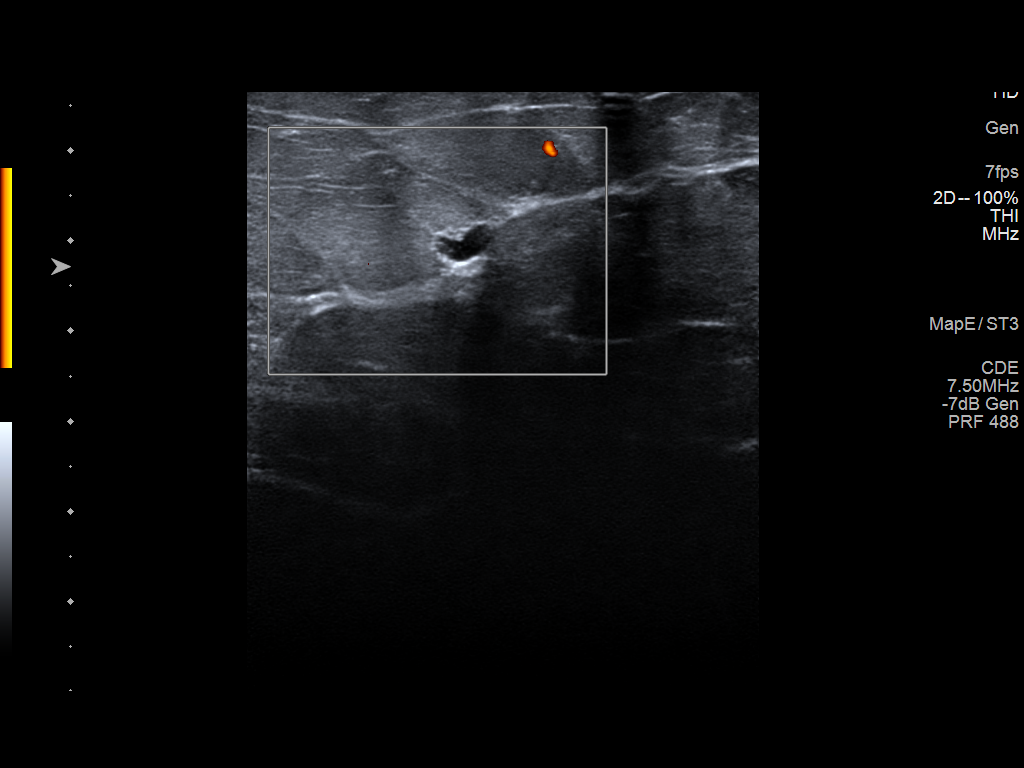
[im 4/6]
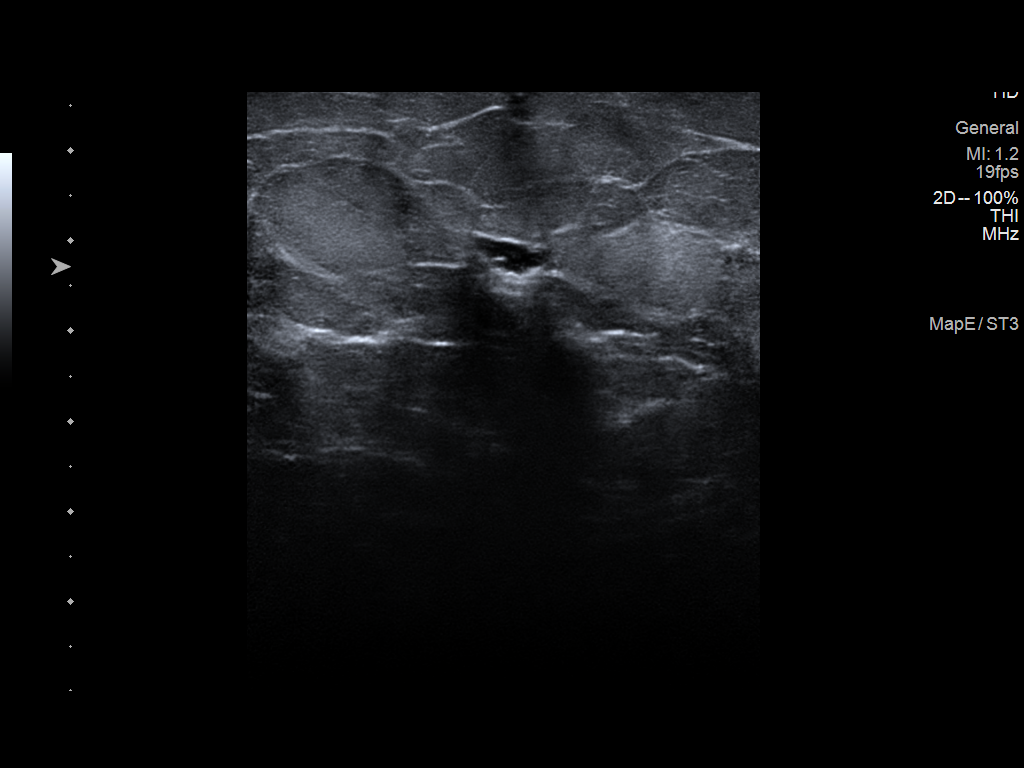
[im 5/6]
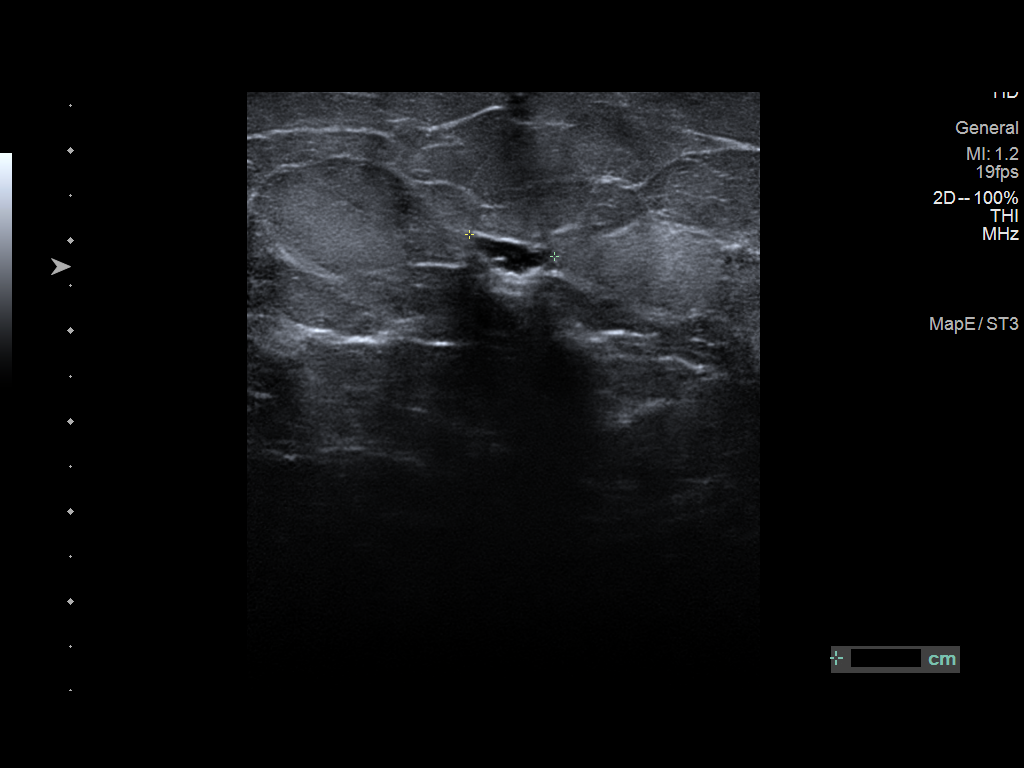
[im 6/6]
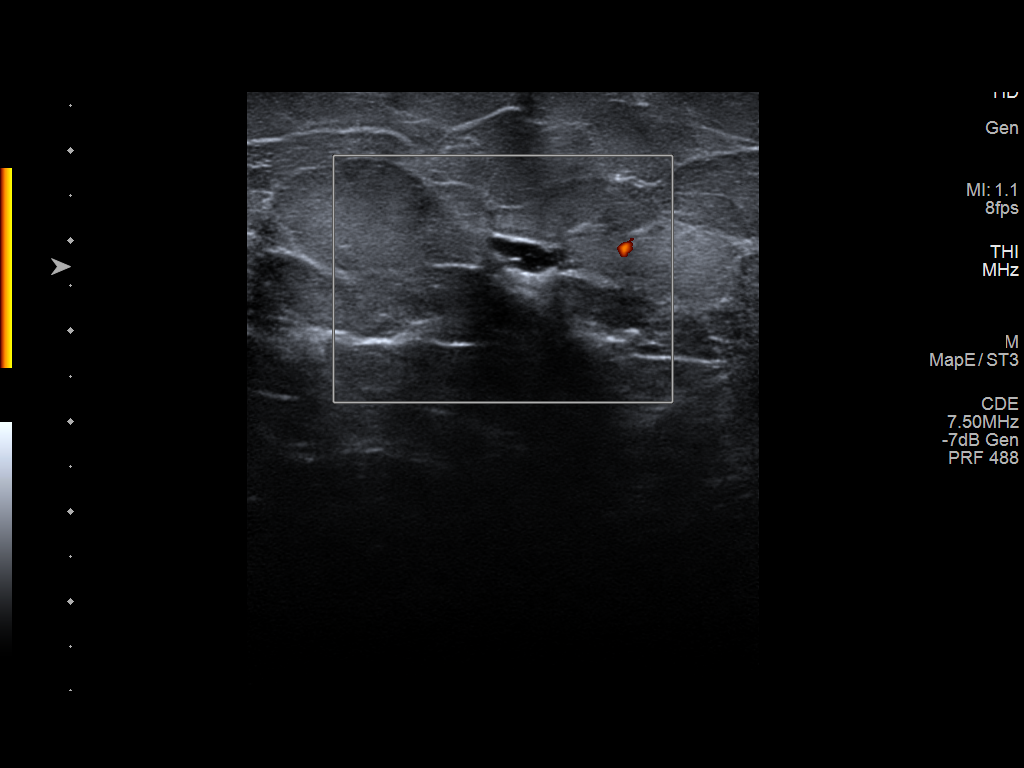

[6 of 6 positions shown; findings below may reference images not displayed]

FINDINGS: Previously described, possible mass the upper central left breast at
middle depth does not persist on today's additional views. An
additional oval, circumscribed equal density mass is noted however,
in the lower inner quadrant at middle depth. Further evaluation with
ultrasound was performed.

Mammographic images were processed with CAD.

Targeted ultrasound is performed, showing an oval, circumscribed
nearly anechoic mass with a single internal septation at the 9
o'clock position 6 cm from the nipple. It measures 10 x 9 x 4 mm.
There is no internal vascularity. This correlates well with the
mammographic finding.
IMPRESSION: Probably benign, probable left breast complicated cyst.
Recommendation is for short-term interval follow-up.

RECOMMENDATION:
Left breast ultrasound in 6 months.

I have discussed the findings and recommendations with the patient.
If applicable, a reminder letter will be sent to the patient
regarding the next appointment.

BI-RADS CATEGORY  3: Probably benign.

## 2021-04-04 ENCOUNTER — Telehealth: Payer: Self-pay

## 2021-04-04 NOTE — Telephone Encounter (Signed)
Pt called and scheduled for acute visit 04/10/21 at 4pm

## 2021-04-04 NOTE — Telephone Encounter (Signed)
Pt called asking about issues with anxiety. She described that her anxiety has been increased, but does not have exact date when it started getting worse. Nothing specific triggers it. Has recently happened while driving and in crowds, but unsure if that is the only thing that causes it. Feels nervous and paranoid. Pt is not taking any medications for anxiety

## 2021-04-08 ENCOUNTER — Other Ambulatory Visit: Payer: Self-pay | Admitting: Internal Medicine

## 2021-04-10 ENCOUNTER — Other Ambulatory Visit: Payer: Self-pay

## 2021-04-10 ENCOUNTER — Ambulatory Visit (INDEPENDENT_AMBULATORY_CARE_PROVIDER_SITE_OTHER): Payer: 59 | Admitting: Internal Medicine

## 2021-04-10 ENCOUNTER — Encounter: Payer: Self-pay | Admitting: Internal Medicine

## 2021-04-10 VITALS — BP 152/82 | HR 64 | Resp 16 | Ht 61.0 in | Wt 260.0 lb

## 2021-04-10 DIAGNOSIS — F419 Anxiety disorder, unspecified: Secondary | ICD-10-CM

## 2021-04-10 MED ORDER — FLUOXETINE HCL 10 MG PO TABS: 10.0000 mg | ORAL_TABLET | Freq: Every day | ORAL | 2 refills | Status: DC

## 2021-04-10 NOTE — Patient Instructions (Signed)
Warm bath and reading before bed. If unable to get to sleep in 20 minutes, get out of bed, go somewhere calm and read until you are sleepy, then back to bed. If awaken and unable to get back to sleep in 20 minutes--read as above until sleepy. Physical activity with a friend every day--gradually work up to 1 hour.  Do not exercise close to bedtime. No napping  

## 2021-04-10 NOTE — Progress Notes (Signed)
    Subjective:    Patient ID: Terri Powers, female   DOB: [birth year], 65 y.o.   MRN: [id number]   HPI   Anxiety:  Started more so when in beginning of June with concerns about her brother with addiction to crack cocaine.  She was asking about treatment centers for him.  Seems to have escalated since.   Does not want to go into crowds.  Went into a crowd when visiting [place] back in June and got sick with COVID.  Now gets more anxious with people around her.  Gets anxious in car when cars around her--braces herself on the dash.  Overthinking what she has to get dne in her next day.  Problems getting to sleep with this.  Also, on verge of panic attacks. Was treated with Paxil in 1999/04/26 after her father died and did not tolerate well--always felt a step behind.    Current Meds  Medication Sig   acetaminophen (TYLENOL) 500 MG tablet Take 1,000 mg by mouth every 6 (six) hours as needed for mild pain.   albuterol (VENTOLIN HFA) 108 (90 Base) MCG/ACT inhaler 2 puffs every 6 hours as needed for wheezing   amLODipine (NORVASC) 5 MG tablet Take 1 tablet by mouth once daily   blood glucose meter kit and supplies KIT Dispense based on patient and insurance preference. Check blood glucose once daily before breakfast or dinner   calcium citrate-vitamin D 500-400 MG-UNIT chewable tablet Chew 1 tablet by mouth 2 (two) times daily.   clobetasol cream (TEMOVATE) 0.05 % Apply 1 application topically 2 (two) times daily.   fluticasone (FLONASE) 50 MCG/ACT nasal spray Place 2 sprays into both nostrils daily.   fluticasone (FLOVENT HFA) 110 MCG/ACT inhaler Inhale 2 puffs into the lungs 2 (two) times daily.   ibuprofen (ADVIL,MOTRIN) 800 MG tablet Take 800 mg by mouth every 8 (eight) hours as needed. for pain   lansoprazole (PREVACID) 15 MG capsule Take 15 mg by mouth daily at 12 noon.   levocetirizine (XYZAL) 5 MG tablet Take 1 tablet (5 mg total) by mouth every evening.   metFORMIN (GLUCOPHAGE-XR) 500 MG 24  hr tablet 2 tabs by mouth daily with breakfast   metoprolol succinate (TOPROL-XL) 100 MG 24 hr tablet TAKE 1 TABLET BY MOUTH ONCE DAILY TAKE  WITH  OR  IMMEDIATELY  FOLLOWING  A  MEAL   montelukast (SINGULAIR) 10 MG tablet TAKE 1 TABLET BY MOUTH AT BEDTIME   oxybutynin (DITROPAN XL) 5 MG 24 hr tablet Take 1 tablet (5 mg total) by mouth at bedtime.   rosuvastatin (CRESTOR) 20 MG tablet TAKE 1 TABLET BY MOUTH  DAILY WITH EVENING MEAL   Allergies  Allergen Reactions   Lisinopril Cough    Side effect   Peach Flavor Hives   Shellfish Allergy     Allergy to seafood     Review of Systems    Objective:   BP (!) 152/82 (BP Location: Left Arm, Patient Position: Sitting, Cuff Size: Normal)   Pulse 64   Resp 16   Ht 5\' 1"  (1.549 m)   Wt 260 lb (117.9 kg)   BMI 49.13 kg/m   Physical Exam NAD Lungs:  CTA CV:  RRR without murmur or rub.    Assessment & Plan   Will go on SW list and sign up for the group therapy. Fluoxetine 10 mg daily Progress call in 1 week and follow up in 6 weeks. Call if problem

## 2021-04-11 ENCOUNTER — Encounter: Payer: Self-pay | Admitting: Clinical

## 2021-04-11 NOTE — Progress Notes (Signed)
Date of service 04/10/2021 LCSW met with patient for a brief warm hand off as a referral from PCP due to increase of anxiety. LCSW informed of options available. LCSW informed patient as she is currently a participant with [facility] one of the benefits of the program is the group therapy. LCSW informed the facilitator will also provide the individual therapy. LCSW informed currently doing a waitlist for the next social worker to reach out to begin treatment. Patient reports she prefers individual therapy and okay to put on list but would be willing to join the group therapy even if it just to listen. Patient reports due to work she gets off at 2pm but agreed to join. LCSW informed will call on Monday as a reminder and to send the link too. Patient expressed understanding. LCSW informed PCP of patient's decision.

## 2021-04-15 ENCOUNTER — Encounter (INDEPENDENT_AMBULATORY_CARE_PROVIDER_SITE_OTHER): Payer: Self-pay

## 2021-04-15 ENCOUNTER — Ambulatory Visit: Payer: 59 | Admitting: Clinical

## 2021-04-15 DIAGNOSIS — F419 Anxiety disorder, unspecified: Secondary | ICD-10-CM

## 2021-04-15 NOTE — Progress Notes (Signed)
   GROUP THERAPY PROGRESS NOTE Via [employer] teams  Session Time: 2-3pm Participation Level: Active Behavioral Response: CasualAlert Type of Therapy: Group Therapy Treatment Goals addressed: decreasing and coping with social isolation and any other life stressors.   Purpose: LCSW met with patient for routine group therapy session to work towards treatment goals of decreasing and coping with social isolation and any other life stressors.   Intervention: LCSW met with patient for routine group therapy session to work towards treatment goals. LCSW reviewed group therapy rules. LCSW introduced new members to group. LCSW asked group member to provide a brief check in to assess for any significant events and how they are doing today. LCSW utilized intervention of Psychosocial Skills: Gratitude . LCSW utilized handouts Gratitude from therapist aid to review exercises of gratitude, followed with a meditation of gratitude utilizing senses from https://www.bennett.com/. To summarize session, LCSW had group members practice in session "Why am I grateful" from therapistaid website. LCSW informed will be emailing resources to reflect back. LCSW thanked patients for this opportunity as this will be last session with this clinician.  LCSW assessed for SI/HI/command psychosis and reminded of crisis resource information (in email).   Effectiveness: Patient is alert x4. Patient joined following her work reports she is okay. Patient participated in session and provided input on the exercises of gratitude. Patient participated in the meditation reporting it felt good. Patient was able to engage in session by providing her responses why she is grateful reports found this intervention effective because she was able to say it out loud.  Progress towards goal is Ongoing. Patient denied active suicidal/homicidal/active psychosis.  Plan Patient offered next appointment for: group therapy session Tuesday 2pm with next  facilitator.     Lujean Rave, LCSW 04/15/2021

## 2021-06-13 ENCOUNTER — Other Ambulatory Visit (INDEPENDENT_AMBULATORY_CARE_PROVIDER_SITE_OTHER): Payer: 59

## 2021-06-13 ENCOUNTER — Other Ambulatory Visit: Payer: Self-pay

## 2021-06-13 DIAGNOSIS — E119 Type 2 diabetes mellitus without complications: Secondary | ICD-10-CM

## 2021-06-13 DIAGNOSIS — Z79899 Other long term (current) drug therapy: Secondary | ICD-10-CM

## 2021-06-13 DIAGNOSIS — E7800 Pure hypercholesterolemia, unspecified: Secondary | ICD-10-CM | POA: Diagnosis not present

## 2021-06-14 LAB — COMPREHENSIVE METABOLIC PANEL
ALT: 10 IU/L (ref 0–32)
AST: 15 IU/L (ref 0–40)
Albumin/Globulin Ratio: 2 (ref 1.2–2.2)
Albumin: 4.3 g/dL (ref 3.8–4.8)
Alkaline Phosphatase: 84 IU/L (ref 44–121)
BUN/Creatinine Ratio: 8 — ABNORMAL LOW (ref 12–28)
BUN: 7 mg/dL — ABNORMAL LOW (ref 8–27)
Bilirubin Total: 0.4 mg/dL (ref 0.0–1.2)
CO2: 23 mmol/L (ref 20–29)
Calcium: 8.9 mg/dL (ref 8.7–10.3)
Chloride: 106 mmol/L (ref 96–106)
Creatinine, Ser: 0.83 mg/dL (ref 0.57–1.00)
Globulin, Total: 2.1 g/dL (ref 1.5–4.5)
Glucose: 122 mg/dL — ABNORMAL HIGH (ref 65–99)
Potassium: 4.1 mmol/L (ref 3.5–5.2)
Sodium: 143 mmol/L (ref 134–144)
Total Protein: 6.4 g/dL (ref 6.0–8.5)
eGFR: 79 mL/min/{1.73_m2} (ref >59)

## 2021-06-14 LAB — HEMOGLOBIN A1C
Est. average glucose Bld gHb Est-mCnc: 126 mg/dL
Hgb A1c MFr Bld: 6 % — ABNORMAL HIGH (ref 4.8–5.6)

## 2021-06-14 LAB — LIPID PANEL W/O CHOL/HDL RATIO
Cholesterol, Total: 151 mg/dL (ref 100–199)
HDL: 49 mg/dL (ref >39)
LDL Chol Calc (NIH): 91 mg/dL (ref 0–99)
Triglycerides: 55 mg/dL (ref 0–149)
VLDL Cholesterol Cal: 11 mg/dL (ref 5–40)

## 2021-06-16 ENCOUNTER — Other Ambulatory Visit: Payer: 59 | Admitting: Internal Medicine

## 2021-06-17 ENCOUNTER — Telehealth: Payer: Self-pay

## 2021-06-17 NOTE — Telephone Encounter (Signed)
Pt wants to know when she can come in to get covid (eligible for 4th shot), flu and pneumonia vaccine

## 2021-06-18 NOTE — Telephone Encounter (Signed)
Pt will wait to get the new covid booster. She will call next week to check in to see if we have received it

## 2021-07-07 ENCOUNTER — Ambulatory Visit (INDEPENDENT_AMBULATORY_CARE_PROVIDER_SITE_OTHER): Payer: 59 | Admitting: Internal Medicine

## 2021-07-07 ENCOUNTER — Other Ambulatory Visit: Payer: Self-pay

## 2021-07-07 DIAGNOSIS — Z23 Encounter for immunization: Secondary | ICD-10-CM | POA: Diagnosis not present

## 2021-08-07 ENCOUNTER — Other Ambulatory Visit: Payer: Self-pay

## 2021-08-07 ENCOUNTER — Ambulatory Visit
Admission: RE | Admit: 2021-08-07 | Discharge: 2021-08-07 | Disposition: A | Discharge status: Home / Self Care | Payer: 59 | Source: Ambulatory Visit | Attending: Internal Medicine | Admitting: Internal Medicine

## 2021-08-07 DIAGNOSIS — N6320 Unspecified lump in the left breast, unspecified quadrant: Secondary | ICD-10-CM

## 2021-10-08 ENCOUNTER — Other Ambulatory Visit: Payer: Self-pay | Admitting: Internal Medicine

## 2021-10-24 ENCOUNTER — Other Ambulatory Visit: Payer: Self-pay

## 2021-10-24 ENCOUNTER — Other Ambulatory Visit (INDEPENDENT_AMBULATORY_CARE_PROVIDER_SITE_OTHER): Payer: Self-pay | Admitting: Internal Medicine

## 2021-10-24 DIAGNOSIS — E7800 Pure hypercholesterolemia, unspecified: Secondary | ICD-10-CM

## 2021-10-24 DIAGNOSIS — Z23 Encounter for immunization: Secondary | ICD-10-CM | POA: Diagnosis not present

## 2021-10-24 DIAGNOSIS — E119 Type 2 diabetes mellitus without complications: Secondary | ICD-10-CM

## 2021-10-24 MED ORDER — FLUTICASONE PROPIONATE HFA 110 MCG/ACT IN AERO
2.0000 | INHALATION_SPRAY | Freq: Two times a day (BID) | RESPIRATORY_TRACT | 11 refills | Status: DC
Start: 2021-10-24 — End: 2022-10-09

## 2021-10-24 MED ORDER — LEVOCETIRIZINE DIHYDROCHLORIDE 5 MG PO TABS: 5.0000 mg | ORAL_TABLET | Freq: Every evening | ORAL | 11 refills | Status: DC

## 2021-10-24 MED ORDER — ALBUTEROL SULFATE HFA 108 (90 BASE) MCG/ACT IN AERS: INHALATION_SPRAY | RESPIRATORY_TRACT | 2 refills | Status: DC

## 2021-10-24 NOTE — Addendum Note (Signed)
Addended by: Marcelino Duster on: 10/24/2021 03:10 PM   Modules accepted: Orders

## 2021-10-24 NOTE — Progress Notes (Signed)
Needs refill of Flovent/Albuterol

## 2021-10-25 LAB — LIPID PANEL W/O CHOL/HDL RATIO
Cholesterol, Total: 125 mg/dL (ref 100–199)
HDL: 51 mg/dL (ref >39)
LDL Chol Calc (NIH): 62 mg/dL (ref 0–99)
Triglycerides: 55 mg/dL (ref 0–149)
VLDL Cholesterol Cal: 12 mg/dL (ref 5–40)

## 2021-10-25 LAB — HEMOGLOBIN A1C
Est. average glucose Bld gHb Est-mCnc: 128 mg/dL
Hgb A1c MFr Bld: 6.1 % — ABNORMAL HIGH (ref 4.8–5.6)

## 2021-11-12 ENCOUNTER — Encounter (HOSPITAL_BASED_OUTPATIENT_CLINIC_OR_DEPARTMENT_OTHER): Payer: Self-pay

## 2021-11-12 ENCOUNTER — Other Ambulatory Visit: Payer: Self-pay

## 2021-11-12 ENCOUNTER — Emergency Department (HOSPITAL_BASED_OUTPATIENT_CLINIC_OR_DEPARTMENT_OTHER)
Admission: EM | Admit: 2021-11-12 | Discharge: 2021-11-12 | Disposition: A | Discharge status: Home / Self Care | Payer: 59 | Attending: Emergency Medicine | Admitting: Emergency Medicine

## 2021-11-12 ENCOUNTER — Emergency Department (HOSPITAL_BASED_OUTPATIENT_CLINIC_OR_DEPARTMENT_OTHER): Payer: 59 | Admitting: Radiology

## 2021-11-12 DIAGNOSIS — S86912A Strain of unspecified muscle(s) and tendon(s) at lower leg level, left leg, initial encounter: Secondary | ICD-10-CM | POA: Diagnosis not present

## 2021-11-12 DIAGNOSIS — S86911A Strain of unspecified muscle(s) and tendon(s) at lower leg level, right leg, initial encounter: Secondary | ICD-10-CM

## 2021-11-12 DIAGNOSIS — S8991XA Unspecified injury of right lower leg, initial encounter: Secondary | ICD-10-CM | POA: Diagnosis present

## 2021-11-12 DIAGNOSIS — Z79899 Other long term (current) drug therapy: Secondary | ICD-10-CM | POA: Diagnosis not present

## 2021-11-12 DIAGNOSIS — W108XXA Fall (on) (from) other stairs and steps, initial encounter: Secondary | ICD-10-CM | POA: Diagnosis not present

## 2021-11-12 MED ORDER — IBUPROFEN 600 MG PO TABS: 600.0000 mg | ORAL_TABLET | Freq: Four times a day (QID) | ORAL | 0 refills | Status: DC | PRN

## 2021-11-12 NOTE — ED Provider Notes (Signed)
[facility] DEPT Provider Note   CSN: [id number] Arrival date & time: 11/12/21  1501     History  Chief Complaint  Patient presents with   Knee Pain    Terri Powers is a 66 y.o. female.  The history is provided by the patient. No language interpreter was used.  Knee Pain Associated symptoms: no fever    66 year old female who presents valuation of right knee injury.  Patient reports 5 days ago as she was walking up the steps at her house, she got tangled up with her dog and missed stepped.  States she stepped hard on her right knee and felt immediate pain about the knee.  She did not fall down to the ground.  Now she reports moderate nonradiating pain about the anterior knee worse with ambulation and especially going up and down the steps.  On occasion she feels that her knee is catching something.  She has not heard of any pop or crack.  She denies any hip or ankle pain or numbness.  She tries he denies without adequate relief.  At rest the pain is minimal.  Home Medications Prior to Admission medications   Medication Sig Start Date End Date Taking? Authorizing Provider  acetaminophen (TYLENOL) 500 MG tablet Take 1,000 mg by mouth every 6 (six) hours as needed for mild pain.    [provider]  albuterol (VENTOLIN HFA) 108 (90 Base) MCG/ACT inhaler 2 puffs every 6 hours as needed for wheezing 10/24/21   Mack Hook, MD  amLODipine (NORVASC) 5 MG tablet Take 1 tablet by mouth once daily 04/08/21   Mack Hook, MD  blood glucose meter kit and supplies KIT Dispense based on patient and insurance preference. Check blood glucose once daily before breakfast or dinner 03/12/21   Mack Hook, MD  calcium citrate-vitamin D 500-400 MG-UNIT chewable tablet Chew 1 tablet by mouth 2 (two) times daily.    [provider]  clobetasol cream (TEMOVATE) 8.93 % Apply 1 application topically 2 (two) times daily. 07/02/20   Mack Hook, MD  FLUoxetine (PROZAC) 10 MG tablet Take 1 tablet (10 mg total) by mouth daily. 04/10/21   Mack Hook, MD  fluticasone (FLONASE) 50 MCG/ACT nasal spray Place 2 sprays into both nostrils daily. 11/02/17   Mack Hook, MD  fluticasone (FLOVENT HFA) 110 MCG/ACT inhaler Inhale 2 puffs into the lungs 2 (two) times daily. 10/24/21   Mack Hook, MD  ibuprofen (ADVIL,MOTRIN) 800 MG tablet Take 800 mg by mouth every 8 (eight) hours as needed. for pain 09/12/18   [provider]  lansoprazole (PREVACID) 15 MG capsule Take 15 mg by mouth daily at 12 noon.    [provider]  levocetirizine (XYZAL) 5 MG tablet Take 1 tablet (5 mg total) by mouth every evening. 10/24/21   Mack Hook, MD  metFORMIN (GLUCOPHAGE-XR) 500 MG 24 hr tablet 2 tabs by mouth daily with breakfast 03/12/21   Mack Hook, MD  metoprolol succinate (TOPROL-XL) 100 MG 24 hr tablet TAKE 1 TABLET BY MOUTH ONCE DAILY TAKE  WITH  OR  IMMEDIATELY  FOLLOWING  A  MEAL 04/08/21   Mack Hook, MD  montelukast (SINGULAIR) 10 MG tablet TAKE 1 TABLET BY MOUTH AT BEDTIME 10/10/21   Mack Hook, MD  [facility] VERIO test strip CHECK BLOOD GLUCOSE ONCE DAILY BEFORE BREAKFAST OR DINNER 10/10/21   Mack Hook, MD  oxybutynin (DITROPAN XL) 5 MG 24 hr tablet Take 1 tablet (5 mg total) by mouth  at bedtime. 11/01/20   Mack Hook, MD  rosuvastatin (CRESTOR) 20 MG tablet TAKE 1 TABLET BY MOUTH  DAILY WITH EVENING MEAL 03/12/21   Mack Hook, MD      Allergies    Lisinopril, Peach flavor, and Shellfish allergy    Review of Systems   Review of Systems  Constitutional:  Negative for fever.  Musculoskeletal:  Positive for arthralgias.   Physical Exam Updated Vital Signs BP (!) 142/81 (BP Location: Right Arm)    Pulse 60    Temp 98.1 F (36.7 C) (Oral)    Resp 16    Ht _0  (1.549 m)    Wt 117.9 kg    SpO2 100%    BMI 49.11 kg/m  Physical Exam Vitals and nursing  note reviewed.  Constitutional:      General: She is not in acute distress.    Appearance: She is well-developed.  HENT:     Head: Atraumatic.  Eyes:     Conjunctiva/sclera: Conjunctivae normal.  Pulmonary:     Effort: Pulmonary effort is normal.  Musculoskeletal:        General: Tenderness (Right knee: Mild tenderness to anterior knee most significant to the tibial tuberosity with normal knee flexion extension and no joint laxity.  Patella is located.  No overlying skin changes.) present.     Cervical back: Neck supple.     Comments: Right hip and right ankle nontender.  Skin:    Findings: No rash.  Neurological:     Mental Status: She is alert.  Psychiatric:        Mood and Affect: Mood normal.    ED Results / Procedures / Treatments   Labs (all labs ordered are listed, but only abnormal results are displayed) Labs Reviewed - No data to display  EKG None  Radiology DG Knee Complete 4 Views Right  Result Date: 11/12/2021 CLINICAL DATA:  Right knee pain after fall. EXAM: RIGHT KNEE - COMPLETE 4+ VIEW COMPARISON:  None. FINDINGS: Minimal medial compartment joint space narrowing. Moderate patellofemoral joint space narrowing. No joint effusion. Mild-to-moderate chronic enthesopathic change at the patellar tendon origin off of the inferior patella. No acute fracture or dislocation. IMPRESSION: Minimal medial compartment joint space narrowing. No acute fracture. Electronically Signed   By: Yvonne Kendall M.D.   On: 11/12/2021 16:28    Procedures Procedures    Medications Ordered in ED Medications - No data to display  ED Course/ Medical Decision Making/ A&P                           Medical Decision Making Amount and/or Complexity of Data Reviewed Radiology: ordered.   BP (!) 142/81 (BP Location: Right Arm)    Pulse 60    Temp 98.1 F (36.7 C) (Oral)    Resp 16    Ht _1  (1.549 m)    Wt 117.9 kg    SpO2 100%    BMI 49.11 kg/m   6:24 PM Patient here with right knee  pain after she stepped on a hard when she missed stepped going up the steps as she was tangled up with her dog.  Incident happened 5 days prior.  She does have tenderness to the anterior tibial tuberosity but no joint laxity and no signs of infection.  X-ray of the right knee was obtained and visualized and independently interpreted by me which shows no acute fracture.  I suspect this is likely to  be a knee sprain/strain, RICE therapy discussed.  Knee sleeve provided for support.  Orthopedic referral given as needed.  I have considered knee fracture, knee dislocation, patella dislocation, internal derangement about the knee, meniscal tear, septic joint, strain/sprain.        Final Clinical Impression(s) / ED Diagnoses Final diagnoses:  Knee strain, right, initial encounter    Rx / DC Orders ED Discharge Orders          Ordered    ibuprofen (ADVIL) 600 MG tablet  Every 6 hours PRN        11/12/21 1828              Domenic Moras, PA-C 11/12/21 1831    Truddie Hidden, MD 11/13/21 1501

## 2021-11-12 NOTE — Discharge Instructions (Addendum)
Your knee pain is likely due to strain of the muscle and possibly a sprain of the ligament.  Please wear knee sleeve for support.  You may take ibuprofen as needed for pain.  Keep it elevated when resting.  You may follow-up with orthopedist as needed.

## 2021-11-12 NOTE — ED Triage Notes (Signed)
Patient here POV from Home with Right Knee Pain.  States she stripped over her Dog this Weekend and injured her Right Knee. No Blood Thinning Medication History.   NAD Noted during Triage. A&Ox4. GCS 15. Ambulatory.

## 2021-12-07 ENCOUNTER — Other Ambulatory Visit: Payer: Self-pay | Admitting: Internal Medicine

## 2022-04-09 ENCOUNTER — Other Ambulatory Visit: Payer: Self-pay | Admitting: Internal Medicine

## 2022-04-13 ENCOUNTER — Telehealth: Payer: Self-pay | Admitting: Internal Medicine

## 2022-04-13 NOTE — Telephone Encounter (Signed)
Patient called to report she needs refills on:  amLODipine (NORVASC) 5 MG tablet  metoprolol succinate (TOPROL-XL) 100 MG 24 hr tablet  metFORMIN (GLUCOPHAGE-XR) 500 MG 24 hr tablet

## 2022-04-14 ENCOUNTER — Other Ambulatory Visit: Payer: Self-pay | Admitting: Internal Medicine

## 2022-04-22 ENCOUNTER — Other Ambulatory Visit: Payer: Self-pay | Admitting: Internal Medicine

## 2022-05-01 ENCOUNTER — Other Ambulatory Visit: Payer: Self-pay | Admitting: Internal Medicine

## 2022-05-24 ENCOUNTER — Other Ambulatory Visit: Payer: Self-pay | Admitting: Internal Medicine

## 2022-05-28 ENCOUNTER — Other Ambulatory Visit: Payer: Self-pay | Admitting: Internal Medicine

## 2022-06-03 ENCOUNTER — Telehealth: Payer: Self-pay

## 2022-06-03 ENCOUNTER — Ambulatory Visit: Payer: Self-pay | Admitting: Internal Medicine

## 2022-06-03 ENCOUNTER — Encounter: Payer: Self-pay | Admitting: Internal Medicine

## 2022-06-03 VITALS — BP 142/66 | HR 64 | Resp 16 | Ht 61.0 in | Wt 268.0 lb

## 2022-06-03 DIAGNOSIS — E7800 Pure hypercholesterolemia, unspecified: Secondary | ICD-10-CM

## 2022-06-03 DIAGNOSIS — Z6841 Body Mass Index (BMI) 40.0 and over, adult: Secondary | ICD-10-CM

## 2022-06-03 DIAGNOSIS — Z79899 Other long term (current) drug therapy: Secondary | ICD-10-CM

## 2022-06-03 DIAGNOSIS — E66813 Obesity, class 3: Secondary | ICD-10-CM

## 2022-06-03 DIAGNOSIS — I10 Essential (primary) hypertension: Secondary | ICD-10-CM

## 2022-06-03 DIAGNOSIS — F17200 Nicotine dependence, unspecified, uncomplicated: Secondary | ICD-10-CM

## 2022-06-03 DIAGNOSIS — Z716 Tobacco abuse counseling: Secondary | ICD-10-CM

## 2022-06-03 DIAGNOSIS — J4530 Mild persistent asthma, uncomplicated: Secondary | ICD-10-CM

## 2022-06-03 DIAGNOSIS — E119 Type 2 diabetes mellitus without complications: Secondary | ICD-10-CM

## 2022-06-03 MED ORDER — SEMAGLUTIDE(0.25 OR 0.5MG/DOS) 2 MG/3ML ~~LOC~~ SOPN: PEN_INJECTOR | SUBCUTANEOUS | 6 refills | Status: DC

## 2022-06-03 NOTE — Patient Instructions (Signed)
Once you start the Ozempic or similar med, decrease Metformin to 1 tab daily with breakfast.

## 2022-06-03 NOTE — Progress Notes (Signed)
Subjective:    Patient ID: Terri Powers, female   DOB: [birth year], 66 y.o.   MRN: [id number]   HPI   Obesity/DM:  Tired of Metformin due to loose stools.  A1C was excellent in January, but has not been back since for labs.  She is not fasting today.  Sugars checked fasting every morning.  Highest has been 154.    2.  Hyperlipidemia:  at goal in January.    3.  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  Has gained weight from stress eating.  4.  Asthma:  Using Flovent regularly.  Has had more difficulty with weather change.  Uses rescue inhaler a couple times a week.  Using allergy medication and Singulair regularly.     5.  Dental issue:  to have a root canal next week.     Current Meds  Medication Sig   albuterol (VENTOLIN HFA) 108 (90 Base) MCG/ACT inhaler 2 puffs every 6 hours as needed for wheezing   amLODipine (NORVASC) 5 MG tablet Take 1 tablet by mouth once daily   AMOXICILLIN PO Take by mouth.   blood glucose meter kit and supplies KIT Dispense based on patient and insurance preference. Check blood glucose once daily before breakfast or dinner   clobetasol cream (TEMOVATE) 0.05 % Apply 1 application topically 2 (two) times daily.   fluticasone (FLONASE) 50 MCG/ACT nasal spray Place 2 sprays into both nostrils daily.   fluticasone (FLOVENT HFA) 110 MCG/ACT inhaler Inhale 2 puffs into the lungs 2 (two) times daily.   ibuprofen (ADVIL) 600 MG tablet Take 1 tablet (600 mg total) by mouth every 6 (six) hours as needed.   levocetirizine (XYZAL) 5 MG tablet Take 1 tablet (5 mg total) by mouth every evening.   LORAZEPAM PO Take by mouth.   metFORMIN (GLUCOPHAGE-XR) 500 MG 24 hr tablet TAKE 2 TABLETS BY MOUTH ONCE DAILY WITH BREAKFAST   metoprolol succinate (TOPROL-XL) 100 MG 24 hr tablet TAKE 1 TABLET BY MOUTH ONCE DAILY. TAKE WITH OR  IMMEDIATELY FOLLOWING A MEAL.   montelukast (SINGULAIR) 10 MG tablet TAKE 1 TABLET BY MOUTH AT BEDTIME   ONETOUCH VERIO test strip CHECK BLOOD GLUCOSE ONCE DAILY BEFORE BREAKFAST OR DINNER   oxybutynin (DITROPAN-XL) 5 MG 24 hr tablet TAKE 1 TABLET BY MOUTH AT BEDTIME   rosuvastatin (CRESTOR) 20 MG tablet TAKE 1 TABLET BY MOUTH ONCE DAILY WITH EVENING MEAL   Allergies  Allergen Reactions   Lisinopril Cough    Side effect   Peach Flavor Hives   Shellfish Allergy     Allergy to seafood     Review of Systems    Objective:   BP (!) 142/66 (BP Location: Right Arm, Patient Position: Sitting, Cuff Size: Normal)   Pulse 64   Resp 16   Ht 5\' 1"  (1.549 m)   Wt 268 lb (121.6 kg)   BMI 50.64 kg/m   Physical Exam NAD Lungs:  CTA CV:  RRR without murmur or rub.  Radial and DP pulses normal and equal Abd:  S, NT, No HSM or mass, + BS LE:  No edema.     Assessment & Plan   DM and obesity:  start semaglutide 0.25 mg weekly.  Weight check in 4 weeks with sugar log.  A1C today with urine microalbumin/crea.  Will wean Metformin as able depending on  whether reachese goals.    2.  Hypertension:  not at goal.  Recheck in 4 weeks when in.    CMP  3. Hypercholesterolemia:  at goal in January.  4.  Tobacco abuse:  encouraged, particularly with asthma, to quit.  Went through options.    5.  Asthma:  continue inhaled corticosteroids.

## 2022-06-03 NOTE — Telephone Encounter (Signed)
Patient called to inform us that her insurance covers Trulicity  .'75mg'$ /0.77m

## 2022-06-04 MED ORDER — TRULICITY 0.75 MG/0.5ML ~~LOC~~ SOAJ: 0.7500 mg | SUBCUTANEOUS | 2 refills | Status: DC

## 2022-06-05 LAB — CBC WITH DIFFERENTIAL/PLATELET
Basophils Absolute: 0 10*3/uL (ref 0.0–0.2)
Basos: 0 %
EOS (ABSOLUTE): 0.2 10*3/uL (ref 0.0–0.4)
Eos: 2 %
Hematocrit: 38.8 % (ref 34.0–46.6)
Hemoglobin: 13.3 g/dL (ref 11.1–15.9)
Immature Grans (Abs): 0 10*3/uL (ref 0.0–0.1)
Immature Granulocytes: 0 %
Lymphocytes Absolute: 2.9 10*3/uL (ref 0.7–3.1)
Lymphs: 41 %
MCH: 31.1 pg (ref 26.6–33.0)
MCHC: 34.3 g/dL (ref 31.5–35.7)
MCV: 91 fL (ref 79–97)
Monocytes Absolute: 0.6 10*3/uL (ref 0.1–0.9)
Monocytes: 9 %
Neutrophils Absolute: 3.3 10*3/uL (ref 1.4–7.0)
Neutrophils: 48 %
Platelets: 326 10*3/uL (ref 150–450)
RBC: 4.28 x10E6/uL (ref 3.77–5.28)
RDW: 13.8 % (ref 11.7–15.4)
WBC: 7 10*3/uL (ref 3.4–10.8)

## 2022-06-05 LAB — COMPREHENSIVE METABOLIC PANEL
ALT: 10 IU/L (ref 0–32)
AST: 14 IU/L (ref 0–40)
Albumin/Globulin Ratio: 1.7 (ref 1.2–2.2)
Albumin: 4.4 g/dL (ref 3.9–4.9)
Alkaline Phosphatase: 88 IU/L (ref 44–121)
BUN/Creatinine Ratio: 9 — ABNORMAL LOW (ref 12–28)
BUN: 6 mg/dL — ABNORMAL LOW (ref 8–27)
Bilirubin Total: 0.4 mg/dL (ref 0.0–1.2)
CO2: 24 mmol/L (ref 20–29)
Calcium: 8.9 mg/dL (ref 8.7–10.3)
Chloride: 104 mmol/L (ref 96–106)
Creatinine, Ser: 0.68 mg/dL (ref 0.57–1.00)
Globulin, Total: 2.6 g/dL (ref 1.5–4.5)
Glucose: 109 mg/dL — ABNORMAL HIGH (ref 70–99)
Potassium: 4 mmol/L (ref 3.5–5.2)
Sodium: 143 mmol/L (ref 134–144)
Total Protein: 7 g/dL (ref 6.0–8.5)
eGFR: 97 mL/min/{1.73_m2} (ref >59)

## 2022-06-05 LAB — HGB A1C W/O EAG: Hgb A1c MFr Bld: 6.4 % — ABNORMAL HIGH (ref 4.8–5.6)

## 2022-06-05 LAB — MICROALBUMIN / CREATININE URINE RATIO
Creatinine, Urine: 128.7 mg/dL
Microalb/Creat Ratio: 13 mg/g creat (ref 0–29)
Microalbumin, Urine: 17.2 ug/mL

## 2022-06-05 NOTE — Telephone Encounter (Signed)
Rx was sent  

## 2022-06-25 ENCOUNTER — Other Ambulatory Visit: Payer: Self-pay | Admitting: Internal Medicine

## 2022-06-29 ENCOUNTER — Other Ambulatory Visit: Payer: Self-pay | Admitting: Internal Medicine

## 2022-06-29 DIAGNOSIS — Z09 Encounter for follow-up examination after completed treatment for conditions other than malignant neoplasm: Secondary | ICD-10-CM

## 2022-07-03 ENCOUNTER — Other Ambulatory Visit (INDEPENDENT_AMBULATORY_CARE_PROVIDER_SITE_OTHER): Payer: Self-pay

## 2022-07-03 VITALS — BP 140/80 | HR 68 | Wt 262.0 lb

## 2022-07-03 DIAGNOSIS — Z0130 Encounter for examination of blood pressure without abnormal findings: Secondary | ICD-10-CM

## 2022-07-03 MED ORDER — MONTELUKAST SODIUM 10 MG PO TABS: 10.0000 mg | ORAL_TABLET | Freq: Every day | ORAL | 7 refills | Status: DC

## 2022-07-03 MED ORDER — ALBUTEROL SULFATE HFA 108 (90 BASE) MCG/ACT IN AERS: INHALATION_SPRAY | RESPIRATORY_TRACT | 2 refills | Status: DC

## 2022-07-03 NOTE — Progress Notes (Unsigned)
Patient has no issue or concerns about taking Trulicity. Patient reported that he max sugars have been 129 and lowest 97. Average sugars have been around 108-110.   Amlodipine '10mg'$  daily

## 2022-07-06 ENCOUNTER — Other Ambulatory Visit: Payer: Self-pay | Admitting: Internal Medicine

## 2022-07-09 ENCOUNTER — Other Ambulatory Visit: Payer: Self-pay

## 2022-07-09 MED ORDER — AMLODIPINE BESYLATE 10 MG PO TABS: 10.0000 mg | ORAL_TABLET | Freq: Every day | ORAL | 3 refills | Status: DC

## 2022-07-11 ENCOUNTER — Other Ambulatory Visit: Payer: Self-pay | Admitting: Internal Medicine

## 2022-07-31 ENCOUNTER — Other Ambulatory Visit (INDEPENDENT_AMBULATORY_CARE_PROVIDER_SITE_OTHER): Payer: Self-pay

## 2022-07-31 VITALS — BP 138/78 | HR 60 | Wt 264.0 lb

## 2022-07-31 DIAGNOSIS — Z0130 Encounter for examination of blood pressure without abnormal findings: Secondary | ICD-10-CM

## 2022-07-31 MED ORDER — TRULICITY 0.75 MG/0.5ML ~~LOC~~ SOAJ: 1.5000 mg | SUBCUTANEOUS | 11 refills | Status: DC

## 2022-07-31 NOTE — Progress Notes (Signed)
Sugars going down. Has increased to 2 lbs since last month. She is eating chips and dip in the evening--emotional eating. Discussed needs to keep craved foods out of house Increase Trulicity to 1.5 mg weekly--starts today Return in 1 month for weigh in A1C with next visit

## 2022-07-31 NOTE — Progress Notes (Signed)
Patient has been taking trulicity consistently. Patient wanted to report constipation since starting rx. Patient has not taken any medication to help with this issue, but has increased her fiber intake through diet which has helped. Patient does not have any other questions or concerns.

## 2022-08-12 ENCOUNTER — Ambulatory Visit
Admission: RE | Admit: 2022-08-12 | Discharge: 2022-08-12 | Disposition: A | Discharge status: Home / Self Care | Payer: 59 | Source: Ambulatory Visit | Attending: Internal Medicine | Admitting: Internal Medicine

## 2022-08-12 ENCOUNTER — Ambulatory Visit
Admission: RE | Admit: 2022-08-12 | Discharge: 2022-08-12 | Disposition: A | Discharge status: Home / Self Care | Payer: Commercial Managed Care - HMO | Source: Ambulatory Visit | Attending: Internal Medicine | Admitting: Internal Medicine

## 2022-08-12 DIAGNOSIS — Z09 Encounter for follow-up examination after completed treatment for conditions other than malignant neoplasm: Secondary | ICD-10-CM

## 2022-08-13 ENCOUNTER — Other Ambulatory Visit: Payer: Self-pay

## 2022-08-13 MED ORDER — TRULICITY 1.5 MG/0.5ML ~~LOC~~ SOAJ: 1.5000 mg | SUBCUTANEOUS | 11 refills | Status: DC

## 2022-08-31 ENCOUNTER — Other Ambulatory Visit (INDEPENDENT_AMBULATORY_CARE_PROVIDER_SITE_OTHER): Payer: Self-pay

## 2022-08-31 VITALS — BP 138/80 | HR 76 | Wt 259.0 lb

## 2022-08-31 DIAGNOSIS — Z0130 Encounter for examination of blood pressure without abnormal findings: Secondary | ICD-10-CM

## 2022-08-31 DIAGNOSIS — E119 Type 2 diabetes mellitus without complications: Secondary | ICD-10-CM

## 2022-09-01 LAB — HEMOGLOBIN A1C
Est. average glucose Bld gHb Est-mCnc: 123 mg/dL
Hgb A1c MFr Bld: 5.9 % — ABNORMAL HIGH (ref 4.8–5.6)

## 2022-09-08 NOTE — Progress Notes (Signed)
After reporting bp to Dr Mulberry, no changes to medication will be made  

## 2022-09-11 ENCOUNTER — Other Ambulatory Visit: Payer: Self-pay | Admitting: Internal Medicine

## 2022-10-02 ENCOUNTER — Other Ambulatory Visit (INDEPENDENT_AMBULATORY_CARE_PROVIDER_SITE_OTHER): Payer: Self-pay

## 2022-10-02 DIAGNOSIS — E7800 Pure hypercholesterolemia, unspecified: Secondary | ICD-10-CM

## 2022-10-03 LAB — LIPID PANEL W/O CHOL/HDL RATIO
Cholesterol, Total: 127 mg/dL (ref 100–199)
HDL: 53 mg/dL (ref >39)
LDL Chol Calc (NIH): 62 mg/dL (ref 0–99)
Triglycerides: 56 mg/dL (ref 0–149)
VLDL Cholesterol Cal: 12 mg/dL (ref 5–40)

## 2022-10-09 ENCOUNTER — Encounter: Payer: Self-pay | Admitting: Internal Medicine

## 2022-10-09 ENCOUNTER — Ambulatory Visit: Payer: Self-pay | Admitting: Internal Medicine

## 2022-10-09 VITALS — BP 138/80 | HR 76 | Resp 20 | Ht 61.0 in | Wt 258.0 lb

## 2022-10-09 DIAGNOSIS — F17200 Nicotine dependence, unspecified, uncomplicated: Secondary | ICD-10-CM

## 2022-10-09 DIAGNOSIS — R7989 Other specified abnormal findings of blood chemistry: Secondary | ICD-10-CM

## 2022-10-09 DIAGNOSIS — I10 Essential (primary) hypertension: Secondary | ICD-10-CM

## 2022-10-09 DIAGNOSIS — Z720 Tobacco use: Secondary | ICD-10-CM

## 2022-10-09 DIAGNOSIS — E119 Type 2 diabetes mellitus without complications: Secondary | ICD-10-CM

## 2022-10-09 DIAGNOSIS — Z6841 Body Mass Index (BMI) 40.0 and over, adult: Secondary | ICD-10-CM

## 2022-10-09 DIAGNOSIS — Z0000 Encounter for general adult medical examination without abnormal findings: Secondary | ICD-10-CM

## 2022-10-09 DIAGNOSIS — E7800 Pure hypercholesterolemia, unspecified: Secondary | ICD-10-CM

## 2022-10-09 DIAGNOSIS — Z716 Tobacco abuse counseling: Secondary | ICD-10-CM

## 2022-10-09 NOTE — Progress Notes (Signed)
Subjective:    Patient ID: Terri Powers, female   DOB: 1956/05/14, 67 y.o.   MRN: [id number]   HPI  CPE without pap  1.  Pap:  TAH and BSO in 2010 for fibroids.    2.  Mammogram:  Last on 08/12/2022 with [place] supporting benign cyst on left.  Recommendation for repeat in 1 year.    3.  Osteoprevention:  Has never had a bone density.  History of low Vitamin D about 12 years ago.  She does not drink milk products, but eats perhaps one serving of cheese daily and one serving yogurt daily.  Lot of walking in home, but nothing focused on herself.    4.  Guaiac Cards/FIT:  Never.   5.  Colonoscopy:  2021 with Dr. Ardis Hughs with + adenomas, no dysplasia.  Plan is for repeat in 2025, December.  6.  Immunizations:  Up to date on vaccines Immunization History  Administered Date(s) Administered   Covid-19, Mrna,Vaccine(Spikevax)58yr and older 09/03/2022   Influenza Inj Mdck Quad Pf 08/31/2016, 09/07/2018   Influenza,inj,Quad PF,6+ Mos 07/15/2019   Influenza-Unspecified 07/20/2013, 07/07/2021, 08/05/2022   Moderna Sars-Covid-2 Vaccination 12/04/2019, 12/27/2019, 08/19/2020   Pneumococcal Polysaccharide-23 07/20/2013   Rsv, Bivalent, Protein Subunit Rsvpref,pf (Evans Lance 09/03/2022   Tdap 06/23/2016   Zoster Recombinat (Shingrix) 07/07/2021, 10/24/2021     7.  Glucose/Cholesterol:  A1C down to 59.0%with Trulicity and she decreased Metformin to 500 mg prior to this A1C.  Cholesterol panel excellent recently as well. Lipid Panel     Component Value Date/Time   CHOL 127 10/02/2022 0859   TRIG 56 10/02/2022 0859   HDL 53 10/02/2022 0859   CHOLHDL 3.6 08/24/2016 0904   LDLCALC 62 10/02/2022 0859   LABVLDL 12 10/02/2022 0859     Current Meds  Medication Sig   albuterol (VENTOLIN HFA) 108 (90 Base) MCG/ACT inhaler 2 puffs every 6 hours as needed for wheezing   amLODipine (NORVASC) 10 MG tablet Take 1 tablet (10 mg total) by mouth daily.   blood glucose meter kit and supplies KIT  Dispense based on patient and insurance preference. Check blood glucose once daily before breakfast or dinner   clobetasol cream (TEMOVATE) 03.00% Apply 1 application topically 2 (two) times daily.   Dulaglutide (TRULICITY) 1.5 MPQ/3.3AQSOPN Inject 1.5 mg into the skin once a week.   fluticasone (FLONASE) 50 MCG/ACT nasal spray Place 2 sprays into both nostrils daily.   ibuprofen (ADVIL) 600 MG tablet Take 1 tablet (600 mg total) by mouth every 6 (six) hours as needed.   lansoprazole (PREVACID) 15 MG capsule Take 15 mg by mouth daily at 12 noon.   levocetirizine (XYZAL) 5 MG tablet Take 1 tablet (5 mg total) by mouth every evening.   metFORMIN (GLUCOPHAGE-XR) 500 MG 24 hr tablet TAKE 2 TABLETS BY MOUTH ONCE DAILY WITH BREAKFAST (Patient taking differently: TAKE 1 TABLETS BY MOUTH ONCE DAILY WITH BREAKFAST)   metoprolol succinate (TOPROL-XL) 100 MG 24 hr tablet TAKE 1 TABLET BY MOUTH ONCE DAILY. TAKE WITH OR IMMEDIATELY FOLLOWING A MEAL.   montelukast (SINGULAIR) 10 MG tablet Take 1 tablet (10 mg total) by mouth at bedtime.   ONETOUCH VERIO test strip CHECK BLOOD GLUCOSE ONCE DAILY BEFORE BREAKFAST OR DINNER   oxybutynin (DITROPAN-XL) 5 MG 24 hr tablet TAKE 1 TABLET BY MOUTH AT BEDTIME   rosuvastatin (CRESTOR) 20 MG tablet TAKE 1 TABLET BY MOUTH ONCE DAILY WITH EVENING MEAL   Allergies  Allergen Reactions  Lisinopril Cough    Side effect   Peach Flavor Hives   Shellfish Allergy     Allergy to seafood   Past Medical History:  Diagnosis Date   Allergic rhinitis    Allergy    Seasonal and Environmental   Anemia    Anxiety    Asthma    Diabetes mellitus without complication (HCC)    GERD (gastroesophageal reflux disease)    Hyperlipidemia    Hypertension    Obesity    Plantar fasciitis    Substance abuse ([place])    12 years ago ( around 2010)   Past Surgical History:  Procedure Laterality Date   ABDOMINAL HYSTERECTOMY  11/2008   TAH and BSO for fibroids   COLONOSCOPY  2010    normal   Family History  Problem Relation Age of Onset   Diabetes Mother    Hypertension Mother    Heart disease Mother        CHF-ischemic with history of MI--cause of death   Breast cancer Mother 12   Cancer Father        Lung   Cancer Brother 57       renal cancer   Heart failure Other    Kidney cancer Other    Colon cancer Neg Hx    Colon polyps Neg Hx    Esophageal cancer Neg Hx    Stomach cancer Neg Hx    Social History   Socioeconomic History   Marital status: Married    Spouse name: Darryl   Number of children: 0   Years of education: Not on file   Highest education level: Not on file  Occupational History   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  Her boyfriend keeps going out and getting more cigarettes.    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   Working now with disabled adults     No children.   Lives with second husband, [name] and one of her clients, age 40 yo   Social Determinants of Health   Financial Resource Strain: Low Risk  (10/09/2022)   Overall Financial Resource Strain (CARDIA)    Difficulty of Paying Living Expenses: Not hard at all  Food Insecurity: No Food Insecurity (10/09/2022)    Hunger Vital Sign    Worried About Running Out of Food in the Last Year: Never true    Ran Out of Food in the Last Year: Never true  Transportation Needs: No Transportation Needs (10/09/2022)   PRAPARE - Hydrologist (Medical):  No    Lack of Transportation (Non-Medical): No  Physical Activity: Sufficiently Active (09/14/2018)   Exercise Vital Sign    Days of Exercise per Week: 7 days    Minutes of Exercise per Session: 70 min  Stress: No Stress Concern Present (09/14/2018)   [place]    Feeling of Stress : Not at all  Social Connections: Moderately Integrated (09/14/2018)   Social Connection and Isolation Panel [NHANES]    Frequency of Communication with Friends and Family: More than three times a week    Frequency of Social Gatherings with Friends and Family: More than three times a week    Attends Religious Services: More than 4 times per year    Active Member of [employer] or Organizations: Not on file    Attends Archivist Meetings: Never    Marital Status: Living with partner  Intimate Partner Violence: Not At Risk (10/09/2022)   Humiliation, Afraid, Rape, and Kick questionnaire    Fear of Current or Ex-Partner: No    Emotionally Abused: No    Physically Abused: No    Sexually Abused: No      Review of Systems  HENT:  Negative for dental problem (Gets regular dental care at [facility].  Extractions earlier in year.).   Eyes:  Negative for visual disturbance (Last eye check in spring of 2023 and no diabetic changes.).  Respiratory:  Negative for shortness of breath.   Cardiovascular:  Negative for chest pain, palpitations and leg swelling.  Gastrointestinal:  Negative for abdominal pain and blood in stool (No melena).  Neurological:  Negative for weakness, numbness and headaches.  Psychiatric/Behavioral:  Negative for dysphoric mood. The patient is nervous/anxious (Related to  work.).       Objective:   BP 138/80 (BP Location: Left Arm, Patient Position: Sitting, Cuff Size: Normal)   Pulse 76   Resp 20   Ht '5\' 1"'$  (1.549 m)   Wt 258 lb (117 kg)   BMI 48.75 kg/m   Physical Exam HENT:     Head: Normocephalic and atraumatic.     Right Ear: Tympanic membrane, ear canal and external ear normal.     Left Ear: Tympanic membrane, ear canal and external ear normal.     Nose: Nose normal.     Mouth/Throat:     Mouth: Mucous membranes are moist.     Pharynx: Oropharynx is clear.  Eyes:     Extraocular Movements: Extraocular movements intact.     Conjunctiva/sclera: Conjunctivae normal.     Pupils: Pupils are equal, round, and reactive to light.     Comments: Discs sharp  Neck:     Thyroid: No thyroid mass or thyromegaly.  Cardiovascular:     Rate and Rhythm: Normal rate and regular rhythm.     Heart sounds: S1 normal and S2 normal. No murmur heard.    No friction rub. No S3 or S4 sounds.     Comments: No carotid bruits.  Carotid, radial, femoral, DP and PT pulses normal and equal.   Pulmonary:     Effort: Pulmonary effort is normal.     Breath sounds: Normal breath sounds and air entry.  Chest:  Breasts:    Right: No inverted nipple, mass or nipple discharge.     Left: No inverted nipple, mass or nipple discharge.  Abdominal:     General: Bowel sounds are normal.     Palpations: Abdomen is soft. There is no  hepatomegaly, splenomegaly or mass.     Tenderness: There is no abdominal tenderness.     Hernia: No hernia is present.  Genitourinary:    Comments: Deferred with hx of TAH BSO for benign reasons. Musculoskeletal:        General: Normal range of motion.     Cervical back: Normal range of motion and neck supple.     Right lower leg: No edema.     Left lower leg: No edema.     Comments: Tender traps  Lymphadenopathy:     Head:     Right side of head: No submental or submandibular adenopathy.     Left side of head: No submental or  submandibular adenopathy.     Cervical: No cervical adenopathy.     Upper Body:     Right upper body: No supraclavicular or axillary adenopathy.     Left upper body: No supraclavicular or axillary adenopathy.     Lower Body: No right inguinal adenopathy. No left inguinal adenopathy.  Skin:    General: Skin is warm.     Capillary Refill: Capillary refill takes less than 2 seconds.  Neurological:     General: No focal deficit present.     Mental Status: She is alert and oriented to person, place, and time.     Cranial Nerves: Cranial nerves 2-12 are intact.     Sensory: Sensation is intact.     Motor: Motor function is intact.     Coordination: Coordination is intact.     Gait: Gait is intact.     Deep Tendon Reflexes: Reflexes are normal and symmetric.  Psychiatric:        Mood and Affect: Mood normal.        Speech: Speech normal.        Behavior: Behavior normal. Behavior is cooperative.      Assessment & Plan   CPE without pap FIT to return in 2 weeks. Vaccines up to date Add Vitamin D level to previous labs--hx of low level.   2. Tobacco abuse:  Will converse with husband about smoking cessation after CT of Chest.  He apparently is having screening CT today.    3.  DM/obesity:  Excellent control with Trulicity and Metformin.  Encouraged continued work with diet and physical activity to get weight down.    4.  Hyperlipidemia:  well controlled.  5.  Hypertension:  controlled

## 2022-10-10 ENCOUNTER — Other Ambulatory Visit: Payer: Self-pay | Admitting: Internal Medicine

## 2022-10-10 LAB — SPECIMEN STATUS REPORT

## 2022-10-10 LAB — VITAMIN D 25 HYDROXY (VIT D DEFICIENCY, FRACTURES): Vit D, 25-Hydroxy: 13.1 ng/mL — ABNORMAL LOW (ref 30.0–100.0)

## 2022-10-27 ENCOUNTER — Other Ambulatory Visit: Payer: Self-pay

## 2022-10-27 ENCOUNTER — Other Ambulatory Visit: Payer: Self-pay | Admitting: Internal Medicine

## 2022-10-27 MED ORDER — TRULICITY 1.5 MG/0.5ML ~~LOC~~ SOAJ: 1.5000 mg | SUBCUTANEOUS | 11 refills | Status: DC

## 2022-10-29 MED ORDER — VITAMIN D3 25 MCG (1000 UT) PO CAPS: 1000.0000 [IU] | ORAL_CAPSULE | Freq: Every day | ORAL | 1 refills | Status: DC

## 2022-10-29 NOTE — Addendum Note (Signed)
Addended by: Marcelino Duster on: 10/29/2022 10:40 PM   Modules accepted: Orders

## 2022-11-02 ENCOUNTER — Telehealth: Payer: Self-pay | Admitting: Internal Medicine

## 2022-11-02 MED ORDER — ATORVASTATIN CALCIUM 80 MG PO TABS: 80.0000 mg | ORAL_TABLET | Freq: Every day | ORAL | 11 refills | Status: DC

## 2022-11-02 NOTE — Telephone Encounter (Signed)
Patient has been informed of medication coverage issues. She was also informed of starting atorvastatin and has been scheduled for labs.

## 2022-11-14 ENCOUNTER — Other Ambulatory Visit: Payer: Self-pay | Admitting: Internal Medicine

## 2022-11-27 ENCOUNTER — Other Ambulatory Visit: Payer: Commercial Managed Care - HMO

## 2023-01-08 ENCOUNTER — Telehealth: Payer: Self-pay

## 2023-01-08 ENCOUNTER — Other Ambulatory Visit: Payer: Self-pay

## 2023-01-08 DIAGNOSIS — E7800 Pure hypercholesterolemia, unspecified: Secondary | ICD-10-CM

## 2023-01-08 MED ORDER — AMOXICILLIN 875 MG PO TABS: 875.0000 mg | ORAL_TABLET | Freq: Two times a day (BID) | ORAL | 0 refills | Status: AC

## 2023-01-08 NOTE — Telephone Encounter (Signed)
Patient reported that she has been dealing with severe allergies for about three weeks. Patient has been able to mostly control allergy symptoms with her allergy medications and tylenol. On 01/04/23 patient began to notice yellow mucus. Color of her mucus has gotten darker every day. Patient feels pressure in her sinus and some discomfort. Patient suspects sinus infection and would like to be treated.

## 2023-01-08 NOTE — Telephone Encounter (Signed)
Patient has been notified of Rx and instructions

## 2023-01-08 NOTE — Telephone Encounter (Signed)
Treating a secondary bacterial sinusitis  Amoxicillin 875 mg twice daily for 10 days. To call for symptoms of yeast infection

## 2023-01-09 LAB — HEPATIC FUNCTION PANEL
ALT: 16 IU/L (ref 0–32)
AST: 16 IU/L (ref 0–40)
Albumin: 4 g/dL (ref 3.9–4.9)
Alkaline Phosphatase: 106 IU/L (ref 44–121)
Bilirubin Total: 0.4 mg/dL (ref 0.0–1.2)
Bilirubin, Direct: 0.12 mg/dL (ref 0.00–0.40)
Total Protein: 6.9 g/dL (ref 6.0–8.5)

## 2023-01-09 LAB — LIPID PANEL W/O CHOL/HDL RATIO
Cholesterol, Total: 128 mg/dL (ref 100–199)
HDL: 48 mg/dL (ref >39)
LDL Chol Calc (NIH): 68 mg/dL (ref 0–99)
Triglycerides: 56 mg/dL (ref 0–149)
VLDL Cholesterol Cal: 12 mg/dL (ref 5–40)

## 2023-02-05 ENCOUNTER — Other Ambulatory Visit: Payer: Self-pay

## 2023-02-09 ENCOUNTER — Other Ambulatory Visit: Payer: Self-pay | Admitting: Internal Medicine

## 2023-02-09 ENCOUNTER — Other Ambulatory Visit: Payer: Self-pay

## 2023-02-09 DIAGNOSIS — R7989 Other specified abnormal findings of blood chemistry: Secondary | ICD-10-CM

## 2023-02-10 LAB — VITAMIN D 25 HYDROXY (VIT D DEFICIENCY, FRACTURES): Vit D, 25-Hydroxy: 34.2 ng/mL (ref 30.0–100.0)

## 2023-03-03 ENCOUNTER — Ambulatory Visit: Payer: Self-pay | Admitting: Internal Medicine

## 2023-03-03 ENCOUNTER — Encounter: Payer: Self-pay | Admitting: Internal Medicine

## 2023-03-03 ENCOUNTER — Telehealth: Payer: Self-pay

## 2023-03-03 VITALS — BP 160/80 | HR 76 | Resp 20 | Ht 61.0 in | Wt 260.0 lb

## 2023-03-03 DIAGNOSIS — J45901 Unspecified asthma with (acute) exacerbation: Secondary | ICD-10-CM

## 2023-03-03 DIAGNOSIS — J302 Other seasonal allergic rhinitis: Secondary | ICD-10-CM

## 2023-03-03 DIAGNOSIS — R059 Cough, unspecified: Secondary | ICD-10-CM

## 2023-03-03 LAB — POCT INFLUENZA A/B
Influenza A, POC: NEGATIVE
Influenza B, POC: NEGATIVE

## 2023-03-03 LAB — POC COVID19 BINAXNOW: SARS Coronavirus 2 Ag: NEGATIVE

## 2023-03-03 MED ORDER — MONTELUKAST SODIUM 10 MG PO TABS: 10.0000 mg | ORAL_TABLET | Freq: Every day | ORAL | 2 refills | Status: DC

## 2023-03-03 MED ORDER — PREDNISONE 10 MG PO TABS: ORAL_TABLET | ORAL | 0 refills | Status: DC

## 2023-03-03 MED ORDER — ALBUTEROL SULFATE (2.5 MG/3ML) 0.083% IN NEBU: 2.5000 mg | INHALATION_SOLUTION | RESPIRATORY_TRACT | 0 refills | Status: DC | PRN

## 2023-03-03 MED ORDER — ALBUTEROL SULFATE (2.5 MG/3ML) 0.083% IN NEBU
2.5000 mg | INHALATION_SOLUTION | Freq: Once | RESPIRATORY_TRACT | Status: AC
  Administered 2023-03-03: 2.5 mg via RESPIRATORY_TRACT

## 2023-03-03 MED ORDER — LEVOCETIRIZINE DIHYDROCHLORIDE 5 MG PO TABS: 5.0000 mg | ORAL_TABLET | Freq: Every evening | ORAL | 2 refills | Status: DC

## 2023-03-03 MED ORDER — OXYBUTYNIN CHLORIDE ER 5 MG PO TB24: 5.0000 mg | ORAL_TABLET | Freq: Every day | ORAL | 2 refills | Status: DC

## 2023-03-03 NOTE — Telephone Encounter (Signed)
Patient called to report that she has been having cough and runny nose since this past weekend. Mucus has a tan color. Patient has been taking Coricidin, but it has not help any. Patient reports that it feels similar to the sinus infections she had 01/08/2023. Patient is willing to come in for an appointment.

## 2023-03-03 NOTE — Telephone Encounter (Signed)
Patient has been seen.

## 2023-03-03 NOTE — Progress Notes (Signed)
    Subjective:    Patient ID: Terri Powers, female   DOB: 10/09/1955, 67 y.o.   MRN: [id number]   HPI  Five days ago, was in the park when very windy and the coughing started then.  Every day since, her cough has worsened. It is productive with thick beige phlegm.   No fever + Itchy watery eyes. Lot of posterior drainage with nasal burning.  The drainage also sets off her cough.   Has been using Coricidin HBP which helps minimally for a while Ginger tea.   Using Albuterol inhaler 4 times daily and has used her client's albuterol neb once.   She has separate tubing for treatments. She is using Xyzal, Flonase, Montelukast regularly.   No GI symptoms   Allergies  Allergen Reactions   Lisinopril Cough    Side effect   Peach Flavor Hives   Shellfish Allergy     Allergy to seafood     Review of Systems    Objective:   BP (!) 160/80 (BP Location: Left Arm, Patient Position: Sitting, Cuff Size: Normal)   Pulse 76   Resp 20   Ht 5\' 1"  (1.549 m)   Wt 260 lb (117.9 kg)   BMI 49.13 kg/m   Physical Exam Congested, wheezy cough throughout history HEENT:  PERRL, EOMI, injected conjunctivae with clear watering bilaterally Chest:  Decreased BS with scattered expiratory wheeze CV:  RRR without murmur or rub.  Radial pulses normal and equal LE:  No edema  Albuterol 2.5 mg neb:  much improved air movement following with minimal end exp wheeze.  No crackles  Assessment & Plan   Allergic asthma/allergy exacerbation:  Prednisone 40 mg for 2 days, then 30 mg for 2 days, then 20 mg for 2 days, then 10 mg for 2 days, then 5 mg for 2 days, then off Samples of albuterol for nebulization given along with tubing equipment for nebulizer.  42 ml  2.  Allergies:  continue allergy meds--goodrx for next month, then will have Medicare.

## 2023-03-04 ENCOUNTER — Telehealth: Payer: Self-pay | Admitting: Internal Medicine

## 2023-03-04 NOTE — Telephone Encounter (Signed)
Patient called in today and stated that she is improving.

## 2023-04-01 ENCOUNTER — Other Ambulatory Visit: Payer: Self-pay

## 2023-04-01 DIAGNOSIS — E119 Type 2 diabetes mellitus without complications: Secondary | ICD-10-CM

## 2023-04-01 DIAGNOSIS — E7800 Pure hypercholesterolemia, unspecified: Secondary | ICD-10-CM

## 2023-04-02 ENCOUNTER — Other Ambulatory Visit: Payer: Self-pay

## 2023-04-02 LAB — LIPID PANEL W/O CHOL/HDL RATIO
Cholesterol, Total: 133 mg/dL (ref 100–199)
HDL: 51 mg/dL (ref >39)
LDL Chol Calc (NIH): 69 mg/dL (ref 0–99)
Triglycerides: 61 mg/dL (ref 0–149)
VLDL Cholesterol Cal: 13 mg/dL (ref 5–40)

## 2023-04-02 LAB — HEMOGLOBIN A1C
Est. average glucose Bld gHb Est-mCnc: 151 mg/dL
Hgb A1c MFr Bld: 6.9 % — ABNORMAL HIGH (ref 4.8–5.6)

## 2023-04-12 ENCOUNTER — Ambulatory Visit (INDEPENDENT_AMBULATORY_CARE_PROVIDER_SITE_OTHER): Payer: Medicare HMO | Admitting: Internal Medicine

## 2023-04-12 ENCOUNTER — Encounter: Payer: Self-pay | Admitting: Internal Medicine

## 2023-04-12 VITALS — BP 138/68 | HR 67 | Resp 16 | Ht 61.0 in | Wt 265.0 lb

## 2023-04-12 DIAGNOSIS — Z6841 Body Mass Index (BMI) 40.0 and over, adult: Secondary | ICD-10-CM

## 2023-04-12 DIAGNOSIS — M25471 Effusion, right ankle: Secondary | ICD-10-CM

## 2023-04-12 DIAGNOSIS — Z720 Tobacco use: Secondary | ICD-10-CM | POA: Diagnosis not present

## 2023-04-12 DIAGNOSIS — Z59819 Housing instability, housed unspecified: Secondary | ICD-10-CM

## 2023-04-12 DIAGNOSIS — M25472 Effusion, left ankle: Secondary | ICD-10-CM

## 2023-04-12 DIAGNOSIS — F17200 Nicotine dependence, unspecified, uncomplicated: Secondary | ICD-10-CM

## 2023-04-12 DIAGNOSIS — E119 Type 2 diabetes mellitus without complications: Secondary | ICD-10-CM | POA: Diagnosis not present

## 2023-04-12 DIAGNOSIS — E66813 Obesity, class 3: Secondary | ICD-10-CM

## 2023-04-12 DIAGNOSIS — Z716 Tobacco abuse counseling: Secondary | ICD-10-CM

## 2023-04-12 DIAGNOSIS — J4530 Mild persistent asthma, uncomplicated: Secondary | ICD-10-CM

## 2023-04-12 DIAGNOSIS — I10 Essential (primary) hypertension: Secondary | ICD-10-CM

## 2023-04-12 MED ORDER — LANSOPRAZOLE 15 MG PO CPDR: DELAYED_RELEASE_CAPSULE | ORAL | 1 refills | Status: DC

## 2023-04-12 MED ORDER — FUROSEMIDE 20 MG PO TABS: ORAL_TABLET | ORAL | 1 refills | Status: DC

## 2023-04-12 MED ORDER — ATORVASTATIN CALCIUM 80 MG PO TABS: 80.0000 mg | ORAL_TABLET | Freq: Every day | ORAL | 3 refills | Status: DC

## 2023-04-12 MED ORDER — VITAMIN D3 25 MCG (1000 UT) PO CAPS: 1000.0000 [IU] | ORAL_CAPSULE | Freq: Every day | ORAL | 1 refills | Status: AC

## 2023-04-12 MED ORDER — TIRZEPATIDE 2.5 MG/0.5ML ~~LOC~~ SOAJ: 2.5000 mg | SUBCUTANEOUS | 1 refills | Status: DC

## 2023-04-12 MED ORDER — METOPROLOL SUCCINATE ER 100 MG PO TB24: ORAL_TABLET | ORAL | 3 refills | Status: DC

## 2023-04-12 MED ORDER — ALBUTEROL SULFATE HFA 108 (90 BASE) MCG/ACT IN AERS: INHALATION_SPRAY | RESPIRATORY_TRACT | 2 refills | Status: DC

## 2023-04-12 MED ORDER — AMLODIPINE BESYLATE 10 MG PO TABS: 10.0000 mg | ORAL_TABLET | Freq: Every day | ORAL | 3 refills | Status: DC

## 2023-04-12 MED ORDER — METFORMIN HCL ER 500 MG PO TB24: ORAL_TABLET | ORAL | 3 refills | Status: DC

## 2023-04-12 NOTE — Progress Notes (Signed)
Subjective:    Patient ID: Terri Powers, female   DOB: 10-02-1956, 67 y.o.   MRN: [id number]   HPI   DM:  A1C up, though still within goal at 6.9%.  She checked with her Medicare and does cover Mounjaro.  She did just joint [facility].  Plans to do water aerobics and walking.  Only up 7 lbs from when stopped.     2.  Hyperlipidemia:  at goal.   Lipid Panel     Component Value Date/Time   CHOL 133 04/01/2023 0850   TRIG 61 04/01/2023 0850   HDL 51 04/01/2023 0850   CHOLHDL 3.6 08/24/2016 0904   LDLCALC 69 04/01/2023 0850   LABVLDL 13 04/01/2023 0850   3.  Swelling of left ankle intermittently.  Watches salt addition.  On feet all day.  Sometimes normal in mornings.    4.  Brother just moved back from [place].  Likely autism spectrum diagnosis.  Unable to find affordable housing for him.    5.  Tobacco Use Disorder:  still smoking 1/2 ppd.  Not really ready to quit.  Did not get CT screen ordered in January.    6.  Asthma:  Nees albuterol HFA.  Uses about twice weekly.  Not using corticosteroid inhaler.  Current Meds  Medication Sig   albuterol (PROVENTIL) (2.5 MG/3ML) 0.083% nebulizer solution Take 3 mLs (2.5 mg total) by nebulization every 4 (four) hours as needed for wheezing or shortness of breath.   albuterol (VENTOLIN HFA) 108 (90 Base) MCG/ACT inhaler 2 puffs every 6 hours as needed for wheezing   amLODipine (NORVASC) 10 MG tablet Take 1 tablet (10 mg total) by mouth daily.   atorvastatin (LIPITOR) 80 MG tablet Take 1 tablet (80 mg total) by mouth daily.   blood glucose meter kit and supplies KIT Dispense based on patient and insurance preference. Check blood glucose once daily before breakfast or dinner   Cholecalciferol (VITAMIN D3) 25 MCG (1000 UT) CAPS Take 1 capsule (1,000 Units total) by mouth daily.   fluticasone (FLONASE) 50 MCG/ACT nasal spray Place 2 sprays into both nostrils daily.   glucose blood (ONETOUCH VERIO) test strip USE TO CHECK BLOOD GLUCOSE  ONCE DAILY BEFORE BREAKFAST OR DINNER   lansoprazole (PREVACID) 15 MG capsule Take 15 mg by mouth daily at 12 noon.   levocetirizine (XYZAL) 5 MG tablet Take 1 tablet (5 mg total) by mouth every evening.   Magnesium 250 MG TABS Take by mouth daily.   metFORMIN (GLUCOPHAGE-XR) 500 MG 24 hr tablet TAKE 2 TABLETS BY MOUTH ONCE DAILY WITH BREAKFAST (Patient taking differently: TAKE 1 TABLETS BY MOUTH ONCE DAILY WITH BREAKFAST)   metoprolol succinate (TOPROL-XL) 100 MG 24 hr tablet TAKE 1 TABLET BY MOUTH ONCE DAILY. TAKE WITH OR IMMEDIATELY FOLLOWING A MEAL.   montelukast (SINGULAIR) 10 MG tablet Take 1 tablet (10 mg total) by mouth at bedtime.   oxybutynin (DITROPAN-XL) 5 MG 24 hr tablet Take 1 tablet (5 mg total) by mouth at bedtime.   Allergies  Allergen Reactions   Lisinopril Cough    Side effect   Peach Flavor Hives   Shellfish Allergy     Allergy to seafood     Review of Systems    Objective:   BP 138/68 (BP Location: Left Arm, Patient Position: Sitting, Cuff Size: Normal)   Pulse 67   Resp 16   Ht 5\' 1"  (1.549 m)   Wt 265 lb (120.2 kg)  BMI 50.07 kg/m   Physical Exam NAD Smells of cigarette smoke Lungs:  CTA CV:  RRR without murmur or rub.  Radial pulse normal and equal.   LE:  No varicosities.     Diabetic Foot Exam - Simple   Simple Foot Form Diabetic Foot exam was performed with the following findings: Yes 04/12/2023  4:04 PM  Visual Inspection No deformities, no ulcerations, no other skin breakdown bilaterally: Yes Sensation Testing Intact to touch and monofilament testing bilaterally: Yes Pulse Check Posterior Tibialis and Dorsalis pulse intact bilaterally: Yes Comments Mild edema of bilateral ankles L >>R      Assessment & Plan   HM:  Pneumococcal after 67th birthday--she has not had any other pneumococcal vaccines other than what is in this chart.  Will obtain at her next weight check in.  2.  DM:  Does have coverage now for [facility], so will start  that at 0.25 mg weekly.  Follow up in 1 month.  At goal, but needs weight loss and would like to come off Metformin with her gi side effects.  3.  Hypertension;  controlled  4.  Obesity:  has [employer].  Start Mounjaro.  5.  Tobacco use disorder:  encouraged working on cessation.  Not ready.  CT screen of lungs.  6.  Asthma:  quit smoking.  Albuterol as needed.  7.  Brother's housing insecurity:  will ask CHW/LCSWA to support finding a spot for him.

## 2023-04-13 ENCOUNTER — Other Ambulatory Visit: Payer: Self-pay

## 2023-04-13 DIAGNOSIS — E119 Type 2 diabetes mellitus without complications: Secondary | ICD-10-CM

## 2023-04-13 MED ORDER — TIRZEPATIDE 2.5 MG/0.5ML ~~LOC~~ SOAJ: 2.5000 mg | SUBCUTANEOUS | 1 refills | Status: DC

## 2023-04-15 ENCOUNTER — Encounter: Payer: Self-pay | Admitting: Internal Medicine

## 2023-04-21 ENCOUNTER — Other Ambulatory Visit: Payer: Self-pay | Admitting: Internal Medicine

## 2023-04-22 ENCOUNTER — Other Ambulatory Visit: Payer: Self-pay

## 2023-04-22 MED ORDER — ACCU-CHEK GUIDE VI STRP: ORAL_STRIP | 11 refills | Status: DC

## 2023-04-22 MED ORDER — ACCU-CHEK GUIDE W/DEVICE KIT: 1.0000 | PACK | Freq: Every day | 0 refills | Status: DC

## 2023-04-22 MED ORDER — ACCU-CHEK SOFTCLIX LANCETS MISC: 11 refills | Status: DC

## 2023-04-26 ENCOUNTER — Other Ambulatory Visit: Payer: Self-pay

## 2023-04-26 MED ORDER — ACCU-CHEK GUIDE ME W/DEVICE KIT: 1.0000 | PACK | Freq: Every day | 0 refills | Status: DC

## 2023-05-03 ENCOUNTER — Ambulatory Visit
Admission: RE | Admit: 2023-05-03 | Discharge: 2023-05-03 | Disposition: A | Discharge status: Home / Self Care | Payer: Medicare HMO | Source: Ambulatory Visit | Attending: Internal Medicine | Admitting: Internal Medicine

## 2023-05-03 DIAGNOSIS — Z720 Tobacco use: Secondary | ICD-10-CM

## 2023-05-11 ENCOUNTER — Other Ambulatory Visit: Payer: Medicare HMO

## 2023-05-18 ENCOUNTER — Other Ambulatory Visit (INDEPENDENT_AMBULATORY_CARE_PROVIDER_SITE_OTHER): Payer: Medicare HMO

## 2023-05-18 VITALS — Wt 266.0 lb

## 2023-05-18 DIAGNOSIS — Z23 Encounter for immunization: Secondary | ICD-10-CM | POA: Diagnosis not present

## 2023-05-18 DIAGNOSIS — E119 Type 2 diabetes mellitus without complications: Secondary | ICD-10-CM

## 2023-05-18 NOTE — Progress Notes (Unsigned)
Since 04/27/2022 patients sugars have been between 89-127 mg/dL consistently. Was shown sugars using glucometer stored readings.   Return in 2 weeks after starting 5mg  dose of Mounjaro.

## 2023-05-20 ENCOUNTER — Other Ambulatory Visit: Payer: Self-pay | Admitting: Internal Medicine

## 2023-05-20 MED ORDER — TIRZEPATIDE 5 MG/0.5ML ~~LOC~~ SOAJ: 5.0000 mg | SUBCUTANEOUS | 1 refills | Status: DC

## 2023-05-28 ENCOUNTER — Other Ambulatory Visit: Payer: Self-pay | Admitting: Internal Medicine

## 2023-06-04 ENCOUNTER — Other Ambulatory Visit: Payer: Self-pay

## 2023-06-11 ENCOUNTER — Other Ambulatory Visit: Payer: Self-pay | Admitting: Internal Medicine

## 2023-06-17 ENCOUNTER — Other Ambulatory Visit: Payer: Medicare HMO

## 2023-06-18 NOTE — Progress Notes (Unsigned)
Weight check in a month.

## 2023-06-18 NOTE — Progress Notes (Unsigned)
Weight check in 2 more weeks before decide on increasing dose.

## 2023-06-25 ENCOUNTER — Ambulatory Visit: Payer: Medicare HMO | Admitting: Internal Medicine

## 2023-07-06 ENCOUNTER — Other Ambulatory Visit: Payer: Medicare HMO

## 2023-07-06 VITALS — Wt 259.0 lb

## 2023-07-06 DIAGNOSIS — E119 Type 2 diabetes mellitus without complications: Secondary | ICD-10-CM

## 2023-07-07 LAB — HEMOGLOBIN A1C
Est. average glucose Bld gHb Est-mCnc: 126 mg/dL
Hgb A1c MFr Bld: 6 % — ABNORMAL HIGH (ref 4.8–5.6)

## 2023-07-14 ENCOUNTER — Other Ambulatory Visit: Payer: Self-pay | Admitting: Internal Medicine

## 2023-07-16 MED ORDER — TIRZEPATIDE 7.5 MG/0.5ML ~~LOC~~ SOAJ: 7.5000 mg | SUBCUTANEOUS | 1 refills | Status: DC

## 2023-07-16 NOTE — Addendum Note (Signed)
Addended by: Marcene Duos on: 07/16/2023 10:45 AM   Modules accepted: Orders, Level of Service

## 2023-07-21 ENCOUNTER — Other Ambulatory Visit: Payer: Self-pay | Admitting: Internal Medicine

## 2023-07-21 DIAGNOSIS — Z1231 Encounter for screening mammogram for malignant neoplasm of breast: Secondary | ICD-10-CM

## 2023-07-23 ENCOUNTER — Other Ambulatory Visit: Payer: Medicare HMO

## 2023-07-26 ENCOUNTER — Ambulatory Visit: Payer: Medicare HMO | Admitting: Internal Medicine

## 2023-08-16 ENCOUNTER — Ambulatory Visit
Admission: RE | Admit: 2023-08-16 | Discharge: 2023-08-16 | Disposition: A | Discharge status: Home / Self Care | Payer: Medicare HMO | Source: Ambulatory Visit | Attending: Internal Medicine | Admitting: Internal Medicine

## 2023-08-16 DIAGNOSIS — Z1231 Encounter for screening mammogram for malignant neoplasm of breast: Secondary | ICD-10-CM

## 2023-08-19 ENCOUNTER — Ambulatory Visit: Payer: Medicare HMO

## 2023-08-27 ENCOUNTER — Other Ambulatory Visit: Payer: Self-pay | Admitting: Internal Medicine

## 2023-08-27 DIAGNOSIS — I2510 Atherosclerotic heart disease of native coronary artery without angina pectoris: Secondary | ICD-10-CM

## 2023-09-06 ENCOUNTER — Ambulatory Visit: Payer: Medicare HMO | Admitting: Psychology

## 2023-09-06 DIAGNOSIS — Z09 Encounter for follow-up examination after completed treatment for conditions other than malignant neoplasm: Secondary | ICD-10-CM

## 2023-09-08 ENCOUNTER — Encounter: Payer: Self-pay | Admitting: Internal Medicine

## 2023-09-08 ENCOUNTER — Telehealth: Payer: Self-pay | Admitting: Psychology

## 2023-09-08 ENCOUNTER — Ambulatory Visit: Payer: Medicare HMO | Admitting: Internal Medicine

## 2023-09-08 ENCOUNTER — Other Ambulatory Visit: Payer: Self-pay | Admitting: Internal Medicine

## 2023-09-08 VITALS — BP 138/78 | HR 72 | Resp 16 | Ht 61.0 in | Wt 255.5 lb

## 2023-09-08 DIAGNOSIS — I10 Essential (primary) hypertension: Secondary | ICD-10-CM | POA: Diagnosis not present

## 2023-09-08 DIAGNOSIS — Z720 Tobacco use: Secondary | ICD-10-CM | POA: Diagnosis not present

## 2023-09-08 DIAGNOSIS — E66813 Obesity, class 3: Secondary | ICD-10-CM | POA: Diagnosis not present

## 2023-09-08 DIAGNOSIS — E119 Type 2 diabetes mellitus without complications: Secondary | ICD-10-CM

## 2023-09-08 DIAGNOSIS — Z6841 Body Mass Index (BMI) 40.0 and over, adult: Secondary | ICD-10-CM

## 2023-09-08 DIAGNOSIS — E7800 Pure hypercholesterolemia, unspecified: Secondary | ICD-10-CM

## 2023-09-08 NOTE — Progress Notes (Signed)
Case Management  Met with client to help plan next steps for brother. Discussed the possibility of her brother going into the pallet homes for the winter to save some money and get back on his feet. Also discussed the possibility of asking her brother if he wanted to go to rehab for an extended stay or possibly a day program, then possibly to a halfway house. Client plans to talk to brother and then we will check back in after.

## 2023-09-08 NOTE — Progress Notes (Signed)
 Subjective:    Patient ID: Terri Powers, female   DOB: 1956/07/23, 67 y.o.   MRN: [id number]   HPI   Hypertension:  BP has been acceptable.    2.  DM:  A1C 6.0% in October.  Fasting sugars generally in 80s prior to Thanksgiving.  Only into 90s during the holidays.  Has lost 11 lbs since back on Mounjaro  in July.    3.  Hyperlipidemia:  Cholesterol was at goal in June.    4.  Tobacco Abuse:  Chantix  about 2 years ago.  Has not tried Wellbutrin, nicotine patches., no nasal nicotine as well.  5.  Coronary artery calcifications on lung cancer CT screen:  has appt with Cardiology on Jan 31st.    Current Meds  Medication Sig   Accu-Chek Softclix Lancets lancets Check blood glucose twice daily before meals (Patient taking differently: Check blood glucose daily before meals)   albuterol  (PROVENTIL ) (2.5 MG/3ML) 0.083% nebulizer solution Take 3 mLs (2.5 mg total) by nebulization every 4 (four) hours as needed for wheezing or shortness of breath.   albuterol  (VENTOLIN  HFA) 108 (90 Base) MCG/ACT inhaler 2 puffs every 6 hours as needed for wheezing   amLODipine  (NORVASC ) 10 MG tablet Take 1 tablet (10 mg total) by mouth daily.   atorvastatin  (LIPITOR) 80 MG tablet Take 1 tablet (80 mg total) by mouth daily.   Blood Glucose Monitoring Suppl (ACCU-CHEK GUIDE ME) w/Device KIT USE AS DIRECTED TO  TEST  BLOOD  SUGAR  TWICE  DAILY  BEFORE  MEALS   Cholecalciferol (VITAMIN D3) 25 MCG (1000 UT) CAPS Take 1 capsule (1,000 Units total) by mouth daily.   fluticasone  (FLONASE ) 50 MCG/ACT nasal spray Place 2 sprays into both nostrils daily.   furosemide  (LASIX ) 20 MG tablet 1/2 to 1 tab by mouth in the morning daily only as needed for ankle and foot swelling.   glucose blood (ACCU-CHEK GUIDE) test strip Check blood glucose twice daily before meals   levocetirizine (XYZAL ) 5 MG tablet TAKE 1 TABLET BY MOUTH ONCE DAILY IN THE EVENING   Magnesium 250 MG TABS Take by mouth daily.   metFORMIN   (GLUCOPHAGE -XR) 500 MG 24 hr tablet TAKE 1 TABLETS BY MOUTH ONCE DAILY WITH BREAKFAST   metoprolol  succinate (TOPROL -XL) 100 MG 24 hr tablet TAKE 1 TABLET BY MOUTH ONCE DAILY. TAKE WITH OR IMMEDIATELY FOLLOWING A MEAL.   montelukast  (SINGULAIR ) 10 MG tablet TAKE 1 TABLET BY MOUTH AT BEDTIME   oxybutynin  (DITROPAN -XL) 5 MG 24 hr tablet TAKE 1 TABLET BY MOUTH AT BEDTIME   tirzepatide  (MOUNJARO ) 7.5 MG/0.5ML Pen Inject 7.5 mg into the skin once a week.   Allergies  Allergen Reactions   Lisinopril  Cough    Side effect   Peach Flavor Hives   Shellfish Allergy     Allergy to seafood     Review of Systems    Objective:   BP 138/78 (BP Location: Right Arm, Patient Position: Sitting, Cuff Size: Normal)   Pulse 72   Resp 16   Ht 5\' 1"  (1.549 m)   Wt 255 lb 8 oz (115.9 kg)   BMI 48.28 kg/m   Physical Exam NAD Lungs:  CTA CV:  RRR without murmur or rub.  Radial and DP pulses normal and equal. LE:  No edema   Assessment & Plan   Hypertension:  fair control  2.  DM:  At goal with control.  Would like to see more weight loss.  Continue present  meds and dosing for DM.    3.  Hypercholesterolemia:  controlled  4.  Tobacco Use Disorder:  She will notify me when her husband has agreed to quit with her.  5.  Coronary artery calcifications on Lung Ca CT screen.  Await cardiology recommendations

## 2023-09-08 NOTE — Patient Instructions (Signed)
Talk with your husband and let me know when you are ready to work together to quit smoking.

## 2023-09-15 ENCOUNTER — Other Ambulatory Visit: Payer: Self-pay

## 2023-10-27 NOTE — Progress Notes (Signed)
CARDIOLOGY CONSULT NOTE       Patient ID: Terri Powers MRN: [id number] DOB/AGE: [birth year] 68 y.o.  Admit date: (Not on file) Referring Physician: Delrae Alfred Primary Physician: Julieanne Manson, MD Primary Cardiologist: New Reason for Consultation: CAD  Active Problems:   * No active hospital problems. *   HPI:  68 y.o. referred by Dr Delrae Alfred for CAD. History of HTN, HLD, DM, obesity, anemia, GAD and distant substance abuse. He smokes 1/2 PPD over 40 years. Lung cancer CT done 05/03/23 showed no cancer but commented on coronary calcium. Scan review by myself showed aortic atherosclerosis and fairly significant 3 vessel coronary calcium . I suspect if quantitated would be over 400.   No clinical CAD or chest pain. Risk factors other than smoking seem fairly well controlled   A1c 6.0 , LDL 69 on high dose lipitor.   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 She likes to take [place] vacations With her husband She is sedentary and eats to many chips She is from [place] [place] and went to [employer] high school   ROS All other systems reviewed and negative except as noted above  Past Medical History:  Diagnosis Date   Allergic rhinitis    Allergy    Seasonal and Environmental   Anemia    Anxiety    Asthma    Diabetes mellitus without complication (HCC)    GERD (gastroesophageal reflux disease)    Hyperlipidemia    Hypertension    Obesity    Plantar fasciitis    Substance abuse (HCC)    12 years ago ( around 2010)    Family History  Problem Relation Age of Onset   Diabetes Mother    Hypertension Mother    Heart disease Mother        CHF-ischemic with history of MI--cause of death   Breast cancer Mother 8   Cancer Father        Lung   Cancer Brother 78       renal cancer   Heart failure Other    Kidney cancer Other    Colon cancer Neg Hx    Colon polyps Neg Hx    Esophageal cancer Neg Hx    Stomach cancer Neg Hx     Social History    Socioeconomic History   Marital status: Married    Spouse name: Darryl   Number of children: 0   Years of education: Not on file   Highest education level: Not on file  Occupational History   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  Her boyfriend keeps going out and getting more cigarettes.    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   Working now with disabled adults     No children.   Lives with second husband, Nelma Rothman and one of her clients, age 72 yo   Social Drivers of Corporate investment banker Strain: Low Risk  (10/09/2022)   Overall Financial Resource Strain (CARDIA)    Difficulty of Paying Living Expenses: Not hard at all  Food Insecurity: No Food Insecurity (10/09/2022)   Hunger Vital Sign    Worried About Running Out of Food in the Last Year: Never true    Ran Out of Food in the Last Year: Never true  Transportation Needs: No Transportation Needs (10/09/2022)   PRAPARE - Administrator, Civil Service (Medical): No    Lack of Transportation (Non-Medical): No  Physical Activity: Sufficiently Active (09/14/2018)   Exercise Vital Sign    Days of Exercise per Week: 7 days     Minutes of Exercise per Session: 70 min  Stress: No Stress Concern Present (09/14/2018)   [employer] of Occupational Health - Occupational Stress Questionnaire    Feeling of Stress : Not at all  Social Connections: Moderately Integrated (09/14/2018)   Social Connection and Isolation Panel [NHANES]    Frequency of Communication with Friends and Family: More than three times a week    Frequency of Social Gatherings with Friends and Family: More than three times a week    Attends Religious Services: More than 4 times per year    Active Member of [employer] or Organizations: Not on file    Attends Banker Meetings: Never    Marital Status: Living with partner  Intimate Partner Violence: Not At Risk (10/09/2022)   Humiliation, Afraid, Rape, and Kick questionnaire    Fear of Current or Ex-Partner: No    Emotionally Abused: No    Physically Abused: No    Sexually Abused: No    Past Surgical History:  Procedure Laterality Date   ABDOMINAL HYSTERECTOMY  11/2008   TAH and BSO for fibroids   COLONOSCOPY  2010   normal      Current Outpatient Medications:    Accu-Chek Softclix Lancets lancets, Check blood glucose twice daily before meals (Patient taking differently: Check blood glucose daily before meals), Disp: 100 each, Rfl: 11   albuterol (PROVENTIL) (2.5 MG/3ML) 0.083% nebulizer solution, Take 3 mLs (2.5 mg total) by nebulization every 4 (four) hours as needed for wheezing or shortness of breath., Disp: 42 mL, Rfl: 0   albuterol (VENTOLIN HFA) 108 (90 Base) MCG/ACT inhaler, 2 puffs every 6 hours as needed for wheezing, Disp: 18 g, Rfl: 2   amLODipine (NORVASC) 10 MG tablet, Take 1 tablet (10 mg total) by mouth daily., Disp: 90 tablet, Rfl: 3   atorvastatin (LIPITOR) 80 MG tablet, Take 1 tablet (80 mg total) by mouth daily., Disp: 90 tablet, Rfl: 3   Blood Glucose Monitoring Suppl (ACCU-CHEK GUIDE ME) w/Device KIT, USE AS DIRECTED TO  TEST  BLOOD  SUGAR  TWICE  DAILY  BEFORE   MEALS, Disp: 1 kit, Rfl: 0   Cholecalciferol (VITAMIN D3) 25 MCG (1000 UT) CAPS, Take 1 capsule (1,000 Units total) by mouth daily., Disp: 90 capsule, Rfl: 1   fluticasone (FLONASE) 50 MCG/ACT nasal spray, Place 2 sprays into both nostrils daily., Disp: , Rfl:  furosemide (LASIX) 20 MG tablet, TAKE 1/2 TO 1 (ONE-HALF TO ONE) TABLET BY MOUTH IN THE MORNING DAILY ONLY AS NEEDED FOR  ANKLE  AND  FOOT  SWELLING, Disp: 30 tablet, Rfl: 0   glucose blood (ACCU-CHEK GUIDE) test strip, Check blood glucose twice daily before meals, Disp: 100 each, Rfl: 11   lansoprazole (PREVACID) 15 MG capsule, TAKE 1 CAPSULE BY MOUTH ONCE DAILY 1/2  HOUR  BEFORE  BREAKFAST (Patient taking differently: TAKE 1 CAPSULE BY MOUTH ONCE DAILY 1/2  HOUR  BEFORE  BREAKFAST), Disp: 30 capsule, Rfl: 0   levocetirizine (XYZAL) 5 MG tablet, TAKE 1 TABLET BY MOUTH ONCE DAILY IN THE EVENING, Disp: 30 tablet, Rfl: 10   Magnesium 250 MG TABS, Take by mouth daily., Disp: , Rfl:    metFORMIN (GLUCOPHAGE-XR) 500 MG 24 hr tablet, TAKE 1 TABLETS BY MOUTH ONCE DAILY WITH BREAKFAST, Disp: 90 tablet, Rfl: 3   metoprolol succinate (TOPROL-XL) 100 MG 24 hr tablet, TAKE 1 TABLET BY MOUTH ONCE DAILY. TAKE WITH OR IMMEDIATELY FOLLOWING A MEAL., Disp: 90 tablet, Rfl: 3   montelukast (SINGULAIR) 10 MG tablet, TAKE 1 TABLET BY MOUTH AT BEDTIME, Disp: 30 tablet, Rfl: 10   oxybutynin (DITROPAN-XL) 5 MG 24 hr tablet, TAKE 1 TABLET BY MOUTH AT BEDTIME, Disp: 30 tablet, Rfl: 10   tirzepatide (MOUNJARO) 7.5 MG/0.5ML Pen, INJECT 7.5 MG INTO THE SKIN ONCE A WEEK, Disp: 4 mL, Rfl: 4    Physical Exam: Blood pressure 138/60, pulse 71, height 5\' 1"  (1.549 m), weight 256 lb 9.6 oz (116.4 kg), SpO2 94%.   Affect appropriate Healthy:  appears stated age HEENT: normal Neck supple with no adenopathy JVP normal no bruits no thyromegaly Lungs clear with no wheezing and good diaphragmatic motion Heart:  S1/S2 no murmur, no rub, gallop or click PMI normal Abdomen:  benighn, BS positve, no tenderness, no AAA no bruit.  No HSM or HJR Distal pulses intact with no bruits No edema Neuro non-focal Skin warm and dry No muscular weakness   Labs:   Lab Results  Component Value Date   WBC 7.0 06/03/2022   HGB 13.3 06/03/2022   HCT 38.8 06/03/2022   MCV 91 06/03/2022   PLT 326 06/03/2022   No results for input(s): "NA", "K", "CL", "CO2", "BUN", "CREATININE", "CALCIUM", "PROT", "BILITOT", "ALKPHOS", "ALT", "AST", "GLUCOSE" in the last 168 hours.  Invalid input(s): "LABALBU" No results found for: "CKTOTAL", "CKMB", "CKMBINDEX", "TROPONINI"  Lab Results  Component Value Date   CHOL 133 04/01/2023   CHOL 128 01/08/2023   CHOL 127 10/02/2022   Lab Results  Component Value Date   HDL 51 04/01/2023   HDL 48 01/08/2023   HDL 53 10/02/2022   Lab Results  Component Value Date   LDLCALC 69 04/01/2023   LDLCALC 68 01/08/2023   LDLCALC 62 10/02/2022   Lab Results  Component Value Date   TRIG 61 04/01/2023   TRIG 56 01/08/2023   TRIG 56 10/02/2022   Lab Results  Component Value Date   CHOLHDL 3.6 08/24/2016   No results found for: "LDLDIRECT"    Radiology: No results found.  EKG: SR rate 71 nonspecific lateral T Wave changes    ASSESSMENT AND PLAN:   CAD: sub clinical with 3 vessel calcium noted on lung cancer CT. Given risk factors and amount of calcium reasonable to set up stress testing Shared decision making favor PET/CT to assess EF, perfusion and MBFR especially given body habitus and chance for false  positive without attenuation correction  HTN:  well controlled continue Norvasc, lasix and metoprolol HLD:  continue high dose statin LDL at goal add 81 mg ASA Smoking:  lung cancer CT 05/03/23 no cancer No luck quitting with Chantix 2 years ago F/U primary consider Welbutrin DM:  Discussed low carb diet.  Target hemoglobin A1c is 6.5 or less.  Continue current medications. GERD:  continue prevacid Weight Loss:  started on [facility]  09/15/23   PET/CT  F/U in a year if stress testing low risk   Signed: Charlton Haws 11/05/2023, 2:28 PM

## 2023-10-28 ENCOUNTER — Other Ambulatory Visit: Payer: Self-pay | Admitting: Internal Medicine

## 2023-11-05 ENCOUNTER — Ambulatory Visit: Payer: Medicare (Managed Care) | Attending: Cardiovascular Disease | Admitting: Cardiovascular Disease

## 2023-11-05 VITALS — BP 138/60 | HR 71 | Ht 61.0 in | Wt 256.6 lb

## 2023-11-05 DIAGNOSIS — E782 Mixed hyperlipidemia: Secondary | ICD-10-CM | POA: Diagnosis not present

## 2023-11-05 DIAGNOSIS — I10 Essential (primary) hypertension: Secondary | ICD-10-CM | POA: Diagnosis not present

## 2023-11-05 DIAGNOSIS — F17200 Nicotine dependence, unspecified, uncomplicated: Secondary | ICD-10-CM

## 2023-11-05 DIAGNOSIS — I2510 Atherosclerotic heart disease of native coronary artery without angina pectoris: Secondary | ICD-10-CM | POA: Diagnosis not present

## 2023-11-05 NOTE — Patient Instructions (Addendum)
Medication Instructions:  Your physician recommends that you continue on your current medications as directed. Please refer to the Current Medication list given to you today.  *If you need a refill on your cardiac medications before your next appointment, please call your pharmacy*  Lab Work: If you have labs (blood work) drawn today and your tests are completely normal, you will receive your results only by: MyChart Message (if you have MyChart) OR A paper copy in the mail If you have any lab test that is abnormal or we need to change your treatment, we will call you to review the results.  Testing/Procedures: Cardiac PET CT scanning, (CAT scanning), is a noninvasive, special x-ray that produces cross-sectional images of the body using x-rays and a computer. CT scans help physicians diagnose and treat medical conditions. For some CT exams, a contrast material is used to enhance visibility in the area of the body being studied. CT scans provide greater clarity and reveal more details than regular x-ray exams.  Follow-Up: At [facility], you and your health needs are our priority.  As part of our continuing mission to provide you with exceptional heart care, we have created designated Provider Care Teams.  These Care Teams include your primary Cardiologist (physician) and Advanced Practice Providers (APPs -  Physician Assistants and Nurse Practitioners) who all work together to provide you with the care you need, when you need it.  We recommend signing up for the patient portal called "MyChart".  Sign up information is provided on this After Visit Summary.  MyChart is used to connect with patients for Virtual Visits (Telemedicine).  Patients are able to view lab/test results, encounter notes, upcoming appointments, etc.  Non-urgent messages can be sent to your provider as well.   To learn more about what you can do with MyChart, go to [email or web address].    Your next appointment:    1 year(s)  Provider:   Charlton Haws, MD     Other Instructions     Please report to Radiology at the [facility] Main Entrance 30 minutes early for your test.  [address] [place], [place] [address]  How to Prepare for Your Cardiac PET/CT Stress Test:  Nothing to eat or drink, except water, 3 hours prior to arrival time.  NO caffeine/decaffeinated products, or chocolate 12 hours prior to arrival. (Please note decaffeinated beverages (teas/coffees) still contain caffeine).  If you have caffeine within 12 hours prior, the test will need to be rescheduled.  Medication instructions: Do not take nitrates (isosorbide mononitrate, Ranexa) the day before or day of test Do not take tamsulosin the day before or morning of test Hold theophylline containing medications for 12 hours. Hold Dipyridamole 48 hours prior to the test.  Diabetic Preparation: If able to eat breakfast prior to 3 hour fasting, you may take all medications, including your insulin. Do not worry if you miss your breakfast dose of insulin - start at your next meal. If you do not eat prior to 3 hour fast-Hold all diabetes (oral and insulin) medications. Patients who wear a continuous glucose monitor MUST remove the device prior to scanning.  You may take your remaining medications with water.  NO perfume, cologne or lotion on chest or abdomen area. FEMALES - Please avoid wearing dresses to this appointment.  Total time is 1 to 2 hours; you may want to bring reading material for the waiting time.   In preparation for your appointment, medication and supplies will  be purchased.  Appointment availability is limited, so if you need to cancel or reschedule, please call the Radiology Department Scheduler at [phone] 24 hours in advance to avoid a cancellation fee of $100.00  What to Expect When you Arrive:  Once you arrive and check in for your appointment, you will be taken to a preparation room within  the Radiology Department.  A technologist or Nurse will obtain your medical history, verify that you are correctly prepped for the exam, and explain the procedure.  Afterwards, an IV will be started in your arm and electrodes will be placed on your skin for EKG monitoring during the stress portion of the exam. Then you will be escorted to the PET/CT scanner.  There, staff will get you positioned on the scanner and obtain a blood pressure and EKG.  During the exam, you will continue to be connected to the EKG and blood pressure machines.  A small, safe amount of a radioactive tracer will be injected in your IV to obtain a series of pictures of your heart along with an injection of a stress agent.    After your Exam:  It is recommended that you eat a meal and drink a caffeinated beverage to counter act any effects of the stress agent.  Drink plenty of fluids for the remainder of the day and urinate frequently for the first couple of hours after the exam.  Your doctor will inform you of your test results within 7-10 business days.  For more information and frequently asked questions, please visit our website: [email or web address]  For questions about your test or how to prepare for your test, please call: Cardiac Imaging Nurse Navigators Office: [phone]

## 2023-11-25 ENCOUNTER — Other Ambulatory Visit: Payer: Self-pay | Admitting: Internal Medicine

## 2023-12-23 ENCOUNTER — Ambulatory Visit: Payer: Medicare (Managed Care) | Admitting: Psychology

## 2023-12-23 DIAGNOSIS — Z09 Encounter for follow-up examination after completed treatment for conditions other than malignant neoplasm: Secondary | ICD-10-CM

## 2024-01-13 ENCOUNTER — Encounter (HOSPITAL_COMMUNITY): Payer: Self-pay

## 2024-01-13 ENCOUNTER — Telehealth (HOSPITAL_COMMUNITY): Payer: Self-pay | Admitting: Emergency Medicine

## 2024-01-13 DIAGNOSIS — R079 Chest pain, unspecified: Secondary | ICD-10-CM

## 2024-01-17 NOTE — Telephone Encounter (Signed)
 PET consent signed Terri Powers

## 2024-01-18 ENCOUNTER — Ambulatory Visit (HOSPITAL_COMMUNITY)
Admission: RE | Admit: 2024-01-18 | Discharge: 2024-01-18 | Disposition: A | Discharge status: Home / Self Care | Payer: Medicare (Managed Care) | Source: Ambulatory Visit | Attending: Cardiovascular Disease | Admitting: Cardiovascular Disease

## 2024-01-18 DIAGNOSIS — I2510 Atherosclerotic heart disease of native coronary artery without angina pectoris: Secondary | ICD-10-CM | POA: Diagnosis not present

## 2024-01-18 DIAGNOSIS — I10 Essential (primary) hypertension: Secondary | ICD-10-CM | POA: Insufficient documentation

## 2024-01-18 DIAGNOSIS — F17200 Nicotine dependence, unspecified, uncomplicated: Secondary | ICD-10-CM | POA: Diagnosis not present

## 2024-01-18 DIAGNOSIS — E782 Mixed hyperlipidemia: Secondary | ICD-10-CM | POA: Insufficient documentation

## 2024-01-18 LAB — NM PET CT CARDIAC PERFUSION MULTI W/ABSOLUTE BLOODFLOW
LV dias vol: 94 mL (ref 46–106)
LV sys vol: 44 mL
MBFR: 2.23
Nuc Rest EF: 53 %
Nuc Stress EF: 57 %
Rest MBF: 0.84 ml/g/min
Rest Nuclear Isotope Dose: 29.9 mCi
ST Depression (mm): 0 mm
Stress MBF: 1.87 ml/g/min
Stress Nuclear Isotope Dose: 29.9 mCi

## 2024-01-18 MED ORDER — RUBIDIUM RB82 GENERATOR (RUBYFILL)
29.9000 | PACK | Freq: Once | INTRAVENOUS | Status: AC
  Administered 2024-01-18: 29.9 via INTRAVENOUS

## 2024-01-18 MED ORDER — REGADENOSON 0.4 MG/5ML IV SOLN
INTRAVENOUS | Status: AC
  Filled 2024-01-18: qty 5

## 2024-01-18 MED ORDER — REGADENOSON 0.4 MG/5ML IV SOLN
0.4000 mg | Freq: Once | INTRAVENOUS | Status: AC
  Administered 2024-01-18: 0.4 mg via INTRAVENOUS

## 2024-01-23 ENCOUNTER — Other Ambulatory Visit: Payer: Self-pay | Admitting: Internal Medicine

## 2024-01-29 ENCOUNTER — Other Ambulatory Visit: Payer: Self-pay | Admitting: Internal Medicine

## 2024-02-04 ENCOUNTER — Other Ambulatory Visit (INDEPENDENT_AMBULATORY_CARE_PROVIDER_SITE_OTHER): Payer: Medicare (Managed Care)

## 2024-02-04 DIAGNOSIS — Z0000 Encounter for general adult medical examination without abnormal findings: Secondary | ICD-10-CM

## 2024-02-06 LAB — COMPREHENSIVE METABOLIC PANEL WITH GFR
ALT: 14 IU/L (ref 0–32)
AST: 18 IU/L (ref 0–40)
Albumin: 4.3 g/dL (ref 3.9–4.9)
Alkaline Phosphatase: 109 IU/L (ref 44–121)
BUN/Creatinine Ratio: 12 (ref 12–28)
BUN: 10 mg/dL (ref 8–27)
Bilirubin Total: 0.6 mg/dL (ref 0.0–1.2)
CO2: 22 mmol/L (ref 20–29)
Calcium: 9.3 mg/dL (ref 8.7–10.3)
Chloride: 102 mmol/L (ref 96–106)
Creatinine, Ser: 0.82 mg/dL (ref 0.57–1.00)
Globulin, Total: 2.4 g/dL (ref 1.5–4.5)
Glucose: 88 mg/dL (ref 70–99)
Potassium: 4.3 mmol/L (ref 3.5–5.2)
Sodium: 140 mmol/L (ref 134–144)
Total Protein: 6.7 g/dL (ref 6.0–8.5)
eGFR: 79 mL/min/{1.73_m2} (ref >59)

## 2024-02-06 LAB — HEMOGLOBIN A1C
Est. average glucose Bld gHb Est-mCnc: 114 mg/dL
Hgb A1c MFr Bld: 5.6 % (ref 4.8–5.6)

## 2024-02-06 LAB — CBC WITH DIFFERENTIAL/PLATELET
Basophils Absolute: 0.1 10*3/uL (ref 0.0–0.2)
Basos: 1 %
EOS (ABSOLUTE): 0.3 10*3/uL (ref 0.0–0.4)
Eos: 4 %
Hematocrit: 41.2 % (ref 34.0–46.6)
Hemoglobin: 13 g/dL (ref 11.1–15.9)
Immature Grans (Abs): 0 10*3/uL (ref 0.0–0.1)
Immature Granulocytes: 0 %
Lymphocytes Absolute: 2.6 10*3/uL (ref 0.7–3.1)
Lymphs: 36 %
MCH: 29.6 pg (ref 26.6–33.0)
MCHC: 31.6 g/dL (ref 31.5–35.7)
MCV: 94 fL (ref 79–97)
Monocytes Absolute: 0.6 10*3/uL (ref 0.1–0.9)
Monocytes: 8 %
Neutrophils Absolute: 3.7 10*3/uL (ref 1.4–7.0)
Neutrophils: 51 %
Platelets: 323 10*3/uL (ref 150–450)
RBC: 4.39 x10E6/uL (ref 3.77–5.28)
RDW: 13.7 % (ref 11.7–15.4)
WBC: 7.2 10*3/uL (ref 3.4–10.8)

## 2024-02-06 LAB — MICROALBUMIN / CREATININE URINE RATIO
Creatinine, Urine: 400.9 mg/dL
Microalb/Creat Ratio: 5 mg/g{creat} (ref 0–29)
Microalbumin, Urine: 20.9 ug/mL

## 2024-02-06 LAB — LIPID PANEL W/O CHOL/HDL RATIO
Cholesterol, Total: 125 mg/dL (ref 100–199)
HDL: 49 mg/dL (ref >39)
LDL Chol Calc (NIH): 63 mg/dL (ref 0–99)
Triglycerides: 57 mg/dL (ref 0–149)
VLDL Cholesterol Cal: 13 mg/dL (ref 5–40)

## 2024-02-07 ENCOUNTER — Encounter: Payer: Self-pay | Admitting: Internal Medicine

## 2024-02-07 ENCOUNTER — Ambulatory Visit (INDEPENDENT_AMBULATORY_CARE_PROVIDER_SITE_OTHER): Payer: Medicare (Managed Care) | Admitting: Internal Medicine

## 2024-02-07 VITALS — BP 138/82 | HR 73 | Resp 16 | Ht 61.0 in | Wt 249.0 lb

## 2024-02-07 DIAGNOSIS — Z716 Tobacco abuse counseling: Secondary | ICD-10-CM | POA: Diagnosis not present

## 2024-02-07 DIAGNOSIS — Z0000 Encounter for general adult medical examination without abnormal findings: Secondary | ICD-10-CM | POA: Diagnosis not present

## 2024-02-07 DIAGNOSIS — Z780 Asymptomatic menopausal state: Secondary | ICD-10-CM

## 2024-02-07 DIAGNOSIS — F17200 Nicotine dependence, unspecified, uncomplicated: Secondary | ICD-10-CM | POA: Diagnosis not present

## 2024-02-07 DIAGNOSIS — E559 Vitamin D deficiency, unspecified: Secondary | ICD-10-CM | POA: Diagnosis not present

## 2024-02-07 DIAGNOSIS — E7800 Pure hypercholesterolemia, unspecified: Secondary | ICD-10-CM

## 2024-02-07 DIAGNOSIS — E210 Primary hyperparathyroidism: Secondary | ICD-10-CM

## 2024-02-07 DIAGNOSIS — L309 Dermatitis, unspecified: Secondary | ICD-10-CM

## 2024-02-07 DIAGNOSIS — E119 Type 2 diabetes mellitus without complications: Secondary | ICD-10-CM

## 2024-02-07 MED ORDER — TACROLIMUS 0.1 % EX CREA: TOPICAL_CREAM | CUTANEOUS | 6 refills | Status: DC

## 2024-02-07 MED ORDER — TIRZEPATIDE 10 MG/0.5ML ~~LOC~~ SOAJ: 10.0000 mg | SUBCUTANEOUS | 11 refills | Status: DC

## 2024-02-07 MED ORDER — TACROLIMUS 0.1 % EX OINT: TOPICAL_OINTMENT | Freq: Two times a day (BID) | CUTANEOUS | 3 refills | Status: AC

## 2024-02-07 MED ORDER — CALCIUM CITRATE 250 MG PO TABS: ORAL_TABLET | ORAL | 3 refills | Status: AC

## 2024-02-07 MED ORDER — EMPAGLIFLOZIN 10 MG PO TABS: 10.0000 mg | ORAL_TABLET | Freq: Every day | ORAL | 11 refills | Status: AC

## 2024-02-07 NOTE — Progress Notes (Signed)
 Subjective:    Patient ID: Terri Powers, female   DOB: September 04, 1956, 68 y.o.   MRN: [id number]   HPI  CPE without pap  1.  Pap:  Normal paps until 2010 when underwent TAH/BSO for fibroids.  2.  Mammogram:  Last 08/2023 and normal.  Mother with Breast cancer in her mid 68s.    3.  Osteoprevention:  History of low Vitamin D  in 2023.  Was normal around this time last year.  She continues to take Vitamin D3 1000 units daily.  4.  Guaiac Cards/FIT:  Has never returned.  Next colonoscopy next year.    5.  Colonoscopy:  Last in 12.2021 with Dr. Howard Macho, who has retired.  2 Adenomas without dysplasia.  Due again 09/2025.    6.  Immunizations: Not clear if COVID 2023-2024 or 2024-2025 Immunization History  Administered Date(s) Administered   Influenza Inj Mdck Quad Pf 08/31/2016, 09/07/2018   Influenza,inj,Quad PF,6+ Mos 07/15/2019   Influenza-Unspecified 07/20/2013, 07/07/2021, 08/05/2022, 06/05/2023   Moderna Covid-19 Fall Seasonal Vaccine 32yrs & older 09/03/2022, 06/05/2023   Moderna Sars-Covid-2 Vaccination 12/04/2019, 12/27/2019, 08/19/2020   PNEUMOCOCCAL CONJUGATE-20 05/18/2023   Pneumococcal Polysaccharide-23 07/20/2013   Rsv, Bivalent, Protein Subunit Rsvpref,pf Pattricia Bores) 09/03/2022   Tdap 06/23/2016   Zoster Recombinant(Shingrix) 07/07/2021, 10/24/2021     7.  Glucose/Cholesterol:  Down 17 lbs since restart of Mounjaro  in August 2024.  Has been taking Metformin  only once daily for 4 months.  A1C now in normal range.   Cholesterol excellent Lipid Panel     Component Value Date/Time   CHOL 125 02/04/2024 0907   TRIG 57 02/04/2024 0907   HDL 49 02/04/2024 0907   CHOLHDL 3.6 08/24/2016 0904   LDLCALC 63 02/04/2024 0907   LABVLDL 13 02/04/2024 0907     Current Meds  Medication Sig   albuterol  (PROVENTIL ) (2.5 MG/3ML) 0.083% nebulizer solution Take 3 mLs (2.5 mg total) by nebulization every 4 (four) hours as needed for wheezing or shortness of breath.    albuterol  (VENTOLIN  HFA) 108 (90 Base) MCG/ACT inhaler 2 puffs every 6 hours as needed for wheezing   amLODipine  (NORVASC ) 10 MG tablet Take 1 tablet (10 mg total) by mouth daily.   atorvastatin  (LIPITOR) 80 MG tablet Take 1 tablet (80 mg total) by mouth daily.   Blood Glucose Monitoring Suppl (ACCU-CHEK GUIDE ME) w/Device KIT USE AS DIRECTED TO  TEST  BLOOD  SUGAR  TWICE  DAILY  BEFORE  MEALS   Cholecalciferol (VITAMIN D3) 25 MCG (1000 UT) CAPS Take 1 capsule (1,000 Units total) by mouth daily.   fluticasone  (FLONASE ) 50 MCG/ACT nasal spray Place 2 sprays into both nostrils daily.   furosemide  (LASIX ) 20 MG tablet TAKE 1/2 TO 1 (ONE-HALF TO ONE) TABLET BY MOUTH ONCE DAILY IN  THE  MORNING  AS  NEEDED  FOR  ANKLE  AND  FOOT  SWELLING   glucose blood (ACCU-CHEK GUIDE) test strip Check blood glucose twice daily before meals   lansoprazole  (PREVACID ) 15 MG capsule TAKE 1 CAPSULE BY MOUTH ONCE DAILY 1/2  HOUR  BEFORE  BREAKFAST (Patient taking differently: TAKE 1 CAPSULE BY MOUTH ONCE DAILY 1/2  HOUR  BEFORE  BREAKFAST)   levocetirizine (XYZAL ) 5 MG tablet TAKE 1 TABLET BY MOUTH ONCE DAILY IN THE EVENING   Magnesium 250 MG TABS Take by mouth daily.   metFORMIN  (GLUCOPHAGE -XR) 500 MG 24 hr tablet TAKE 1 TABLETS BY MOUTH ONCE DAILY WITH BREAKFAST   metoprolol  succinate (TOPROL -XL)  100 MG 24 hr tablet TAKE 1 TABLET BY MOUTH ONCE DAILY. TAKE WITH OR IMMEDIATELY FOLLOWING A MEAL.   montelukast  (SINGULAIR ) 10 MG tablet TAKE 1 TABLET BY MOUTH AT BEDTIME   oxybutynin  (DITROPAN -XL) 5 MG 24 hr tablet TAKE 1 TABLET BY MOUTH AT BEDTIME   tirzepatide  (MOUNJARO ) 7.5 MG/0.5ML Pen INJECT 7.5 MG INTO THE SKIN ONCE A WEEK   Allergies  Allergen Reactions   Lisinopril  Cough    Side effect   Peach Flavoring Agent (Non-Screening) Hives   Shellfish Allergy     Allergy to seafood     Review of Systems  Eyes:  Negative for visual disturbance (Dr. Kerstin Peeling on 15th of May.).  Cardiovascular:  Positive for leg  swelling (left foot at times when on feet for prolonged period of time). Negative for chest pain.  Gastrointestinal:  Negative for abdominal pain and blood in stool (no melena).  Psychiatric/Behavioral:         Stress from group home, brother who moved the area.   Church activities.   Has not paid attention to health concerns      Objective:   BP 138/82 (BP Location: Right Arm, Patient Position: Sitting, Cuff Size: Normal)   Pulse 73   Resp 16   Ht 5\' 1"  (1.549 m)   Wt 249 lb (112.9 kg)   SpO2 98%   BMI 47.05 kg/m   Physical Exam Constitutional:      Appearance: She is obese.  HENT:     Head: Normocephalic and atraumatic.     Right Ear: Ear canal and external ear normal.     Left Ear: Tympanic membrane, ear canal and external ear normal.     Ears:     Comments: Right TM dull    Nose: Nose normal.     Mouth/Throat:     Mouth: Mucous membranes are moist.     Pharynx: Oropharynx is clear.  Eyes:     Extraocular Movements: Extraocular movements intact.     Conjunctiva/sclera: Conjunctivae normal.     Pupils: Pupils are equal, round, and reactive to light.  Neck:     Thyroid: No thyroid mass or thyromegaly.  Cardiovascular:     Rate and Rhythm: Normal rate and regular rhythm.     Pulses:          Dorsalis pedis pulses are 2+ on the right side and 2+ on the left side.       Posterior tibial pulses are 2+ on the right side and 2+ on the left side.     Heart sounds: S1 normal and S2 normal. No murmur heard.    No friction rub. No S3 or S4 sounds.     Comments: No carotid bruits.  Carotid, radial, femoral, DP and PT pulses normal and equal.   Pulmonary:     Effort: Pulmonary effort is normal.     Breath sounds: Normal breath sounds and air entry.  Chest:  Breasts:    Right: No inverted nipple, mass or nipple discharge.     Left: No inverted nipple, mass or nipple discharge.  Abdominal:     General: Bowel sounds are normal.     Palpations: Abdomen is soft. There is no  hepatomegaly, splenomegaly or mass.     Tenderness: There is no abdominal tenderness.     Hernia: No hernia is present.  Genitourinary:    Comments: No GU organs nor complaints Musculoskeletal:        General: Normal range of  motion.     Cervical back: Normal range of motion and neck supple.     Right lower leg: No edema.     Left lower leg: 1+ Edema present.  Feet:     Right foot:     Protective Sensation: 10 sites tested.  10 sites sensed.     Skin integrity: Skin integrity normal.     Toenail Condition: Right toenails are normal.     Left foot:     Protective Sensation: 10 sites tested.  10 sites sensed.     Skin integrity: Skin integrity normal.     Toenail Condition: Left toenails are normal.  Lymphadenopathy:     Head:     Right side of head: No submental or submandibular adenopathy.     Left side of head: No submental or submandibular adenopathy.     Cervical: No cervical adenopathy.     Upper Body:     Right upper body: No supraclavicular or axillary adenopathy.     Left upper body: No supraclavicular or axillary adenopathy.     Lower Body: No right inguinal adenopathy. No left inguinal adenopathy.  Skin:    General: Skin is warm.     Capillary Refill: Capillary refill takes less than 2 seconds.          Comments: Tiny bumpiness scattered over radial and dorsal forearms. Oval cluster of thickening and larger bumpiness on anterior tib area, left leg Skin of leg generally dry  Neurological:     General: No focal deficit present.     Mental Status: She is alert and oriented to person, place, and time.     Cranial Nerves: Cranial nerves 2-12 are intact.     Sensory: Sensation is intact.     Motor: Motor function is intact.     Coordination: Coordination is intact.     Gait: Gait is intact.     Deep Tendon Reflexes: Reflexes are normal and symmetric.  Psychiatric:        Mood and Affect: Mood normal.        Speech: Speech normal.        Behavior: Behavior normal.  Behavior is cooperative.      Assessment & Plan   CPE No GU organs Mammogram in November DEXA Check on whether COVID for this year or last and obtain for this year if needed COvID and influenza in Sept/Oct  2.  Colon polyps:  F/U in 09/2025 with [facility]  3.  DM/obesity:  when done with 2 months of Mounjaro  7.5 mg, switch to 10 mg for control and increased weight loss.  Stopping Metformin  and adding Jardiance 10 mg daily.  A1C now in normal range, but may bump back up for a while with changes.  Weight check and sugars in 3 months after using Mounjaro  10 mg weekly for 1 month.    4.  Hypertension:  controlled  5.  Hypercholesterolemia:  at goal on high dose statin  6.  Eczema:  Try topical tacrolimus twice daily to affected areas.  Derm referral for newer agent information.

## 2024-02-07 NOTE — Addendum Note (Signed)
 Addended by: Ricky Doan, [place] N on: 02/07/2024 01:05 PM   Modules accepted: Orders

## 2024-02-09 ENCOUNTER — Encounter: Payer: Self-pay | Admitting: Internal Medicine

## 2024-02-21 NOTE — Progress Notes (Signed)
 Case Management 02/21/2024  Abby helped Terri Powers and her brother Terri Powers fill out the application for [employer]. Terri Powers is getting housed at [facility] on 02/21/2024. We will hold onto the application until he moves in. They also made an appointment so that he could start seeing Dr. Jayne Mews for his primary care once he is housed. SW and client will touch base a little close to the move in date.

## 2024-02-24 ENCOUNTER — Other Ambulatory Visit: Payer: Self-pay

## 2024-02-24 MED ORDER — MONTELUKAST SODIUM 10 MG PO TABS: 10.0000 mg | ORAL_TABLET | Freq: Every day | ORAL | 10 refills | Status: AC

## 2024-02-29 ENCOUNTER — Other Ambulatory Visit: Payer: Self-pay

## 2024-02-29 MED ORDER — LEVOCETIRIZINE DIHYDROCHLORIDE 5 MG PO TABS: 5.0000 mg | ORAL_TABLET | Freq: Every evening | ORAL | 11 refills | Status: AC

## 2024-03-01 ENCOUNTER — Telehealth: Payer: Self-pay

## 2024-03-01 NOTE — Telephone Encounter (Signed)
 Patient would like bone density test done at [facility] due to [facility] iamging has a long wait list

## 2024-03-06 NOTE — Telephone Encounter (Signed)
 Referral faxed

## 2024-04-11 ENCOUNTER — Telehealth: Payer: Self-pay | Admitting: Internal Medicine

## 2024-04-11 NOTE — Telephone Encounter (Signed)
 Appt scheduled for tomorrow.

## 2024-04-11 NOTE — Telephone Encounter (Signed)
 Patient needs an appointment for patient believes she is having allergic reaction to medication Jardiance .   Patient reports after last visit with doctor patient started medication and she noticed a ball in her vagina, it burst pus came out , patient states she was good but a week later she had another one same place also went through like a cycle burst and pus came out. Patient also reports two weeks ago she noticed one ball  on right breast and now has one on left but has taken longer to burst out. Patient reports ball is right under breast where bra goes and is uncomfortable . Patient denies fever.  Patient has use Boil ease ointment to ease pain   Patient would like an appointment or if she can get a prescription for antibiotics.

## 2024-04-12 ENCOUNTER — Ambulatory Visit (INDEPENDENT_AMBULATORY_CARE_PROVIDER_SITE_OTHER): Payer: Medicare (Managed Care) | Admitting: Internal Medicine

## 2024-04-12 ENCOUNTER — Encounter: Payer: Self-pay | Admitting: Internal Medicine

## 2024-04-12 VITALS — BP 140/72 | HR 68 | Resp 20 | Ht 61.0 in | Wt 243.0 lb

## 2024-04-12 DIAGNOSIS — L0292 Furuncle, unspecified: Secondary | ICD-10-CM

## 2024-04-12 MED ORDER — MUPIROCIN 2 % EX OINT: TOPICAL_OINTMENT | CUTANEOUS | 0 refills | Status: DC

## 2024-04-12 MED ORDER — CHLORHEXIDINE GLUCONATE 4 % EX SOLN: CUTANEOUS | 1 refills | Status: DC

## 2024-04-12 NOTE — Progress Notes (Signed)
 Subjective:    Patient ID: Terri Powers, female   DOB: 1956-09-18, 68 y.o.   MRN: [id number]   HPI  Less than 2 weeks after starting Jardiance , developed boil in vulvar area.  Following week, another in similar area.  About 1 1/2 weeks ago, had another under her right breast.  Now with one under left breast.   Has never had boils like this before. She has continued Jardiance .    Current Meds  Medication Sig   Accu-Chek Softclix Lancets lancets Check blood glucose twice daily before meals   albuterol  (PROVENTIL ) (2.5 MG/3ML) 0.083% nebulizer solution Take 3 mLs (2.5 mg total) by nebulization every 4 (four) hours as needed for wheezing or shortness of breath.   albuterol  (VENTOLIN  HFA) 108 (90 Base) MCG/ACT inhaler 2 puffs every 6 hours as needed for wheezing   amLODipine  (NORVASC ) 10 MG tablet Take 1 tablet (10 mg total) by mouth daily.   atorvastatin  (LIPITOR) 80 MG tablet Take 1 tablet (80 mg total) by mouth daily.   Blood Glucose Monitoring Suppl (ACCU-CHEK GUIDE ME) w/Device KIT USE AS DIRECTED TO  TEST  BLOOD  SUGAR  TWICE  DAILY  BEFORE  MEALS   Calcium  Citrate 250 MG TABS 2 tabs by mouth twice daily (Patient taking differently: 2 tabs by mouth once daily)   Cholecalciferol (VITAMIN D3) 25 MCG (1000 UT) CAPS Take 1 capsule (1,000 Units total) by mouth daily.   empagliflozin  (JARDIANCE ) 10 MG TABS tablet Take 1 tablet (10 mg total) by mouth daily before breakfast.   fluticasone  (FLONASE ) 50 MCG/ACT nasal spray Place 2 sprays into both nostrils daily.   furosemide  (LASIX ) 20 MG tablet TAKE 1/2 TO 1 (ONE-HALF TO ONE) TABLET BY MOUTH ONCE DAILY IN  THE  MORNING  AS  NEEDED  FOR  ANKLE  AND  FOOT  SWELLING   glucose blood (ACCU-CHEK GUIDE) test strip Check blood glucose twice daily before meals   levocetirizine (XYZAL ) 5 MG tablet Take 1 tablet (5 mg total) by mouth every evening.   Magnesium 250 MG TABS Take by mouth daily.   metoprolol  succinate (TOPROL -XL) 100 MG 24 hr  tablet TAKE 1 TABLET BY MOUTH ONCE DAILY. TAKE WITH OR IMMEDIATELY FOLLOWING A MEAL.   montelukast  (SINGULAIR ) 10 MG tablet Take 1 tablet (10 mg total) by mouth at bedtime.   oxybutynin  (DITROPAN -XL) 5 MG 24 hr tablet TAKE 1 TABLET BY MOUTH AT BEDTIME   tirzepatide  (MOUNJARO ) 10 MG/0.5ML Pen Inject 10 mg into the skin once a week.   Allergies  Allergen Reactions   Lisinopril  Cough    Side effect   Peach Flavoring Agent (Non-Screening) Hives   Shellfish Allergy     Allergy to seafood     Review of Systems    Objective:   BP (!) 140/72 (BP Location: Left Arm, Patient Position: Sitting, Cuff Size: Large)   Pulse 68   Resp 20   Ht 5' 1 (1.549 m)   Wt 243 lb (110.2 kg)   BMI 45.91 kg/m   Physical Exam Furuncle with still a yellow core about 1 cm under mid left breast. Minimal erythema.  Tender.  No surrounding fluctuance.    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 Warm pack to current lesion for 20 minutes twice daily.  Start Mupirocin  to nares and current lesion twice daily for 10 days.  Chlorhexidine  full body wash daily for 10 days to see if will  resolve recurrent furuncles--possibly MRSA or other staph carrier.

## 2024-05-08 ENCOUNTER — Telehealth: Payer: Self-pay | Admitting: Internal Medicine

## 2024-05-08 NOTE — Telephone Encounter (Signed)
 VM left in regards to glucose monitor. Ok to bring to her next appt.

## 2024-05-08 NOTE — Telephone Encounter (Signed)
 Called patient to confirm weight check appointment for 05/10/24 , patient states she has not been writing down her sugar levels, patient states she was not aware she was suppose to write them down, patient states she can bring in meter and would like to know if that is okay?

## 2024-05-10 ENCOUNTER — Other Ambulatory Visit (INDEPENDENT_AMBULATORY_CARE_PROVIDER_SITE_OTHER): Payer: Medicare (Managed Care)

## 2024-05-10 VITALS — Wt 242.0 lb

## 2024-05-10 DIAGNOSIS — E66813 Obesity, class 3: Secondary | ICD-10-CM

## 2024-05-10 DIAGNOSIS — Z6841 Body Mass Index (BMI) 40.0 and over, adult: Secondary | ICD-10-CM | POA: Diagnosis not present

## 2024-05-10 DIAGNOSIS — E119 Type 2 diabetes mellitus without complications: Secondary | ICD-10-CM

## 2024-05-10 NOTE — Progress Notes (Signed)
 Only sugars from morning.  Range 80-128.   Current weight 242 lbs, previously 243 lbs.   Using Mounjaro  10 mg weekly and Jardiance  10 mg daily A1C 5.6% in May.   Has follow up in 2 weeks and in October.   Will bring sugar log with twice daily checks. No change to meds.

## 2024-05-21 ENCOUNTER — Other Ambulatory Visit: Payer: Self-pay | Admitting: Internal Medicine

## 2024-05-24 ENCOUNTER — Other Ambulatory Visit: Payer: Medicare (Managed Care)

## 2024-05-24 NOTE — Progress Notes (Signed)
  Current weight 241lbs, previously 242lbs.   Using Mounjaro  10 mg weekly and Jardiance  10 mg daily.Injection day -Tuesday.No current issues with the medication.Advised pt. To take pm glucose levels.    Sugar fine 86 -103 Weight lost minimal will call pt to see if she is satisfied with current weight.

## 2024-05-25 ENCOUNTER — Other Ambulatory Visit: Payer: Self-pay | Admitting: Internal Medicine

## 2024-06-07 ENCOUNTER — Other Ambulatory Visit: Payer: Medicare (Managed Care)

## 2024-06-07 MED ORDER — FUROSEMIDE 20 MG PO TABS: ORAL_TABLET | ORAL | 4 refills | Status: DC

## 2024-06-07 NOTE — Progress Notes (Unsigned)
 Blood sugars are excellent at 85 to 108 in AM Few sugars documented in pm but range is 95-103. Have her return for an A1C in next week. See if she is eating in a healthy way and is physically active as she gained weight Also,see if she is okay where she is at as sugars good--though will see what her A1C says. Has another appt on the 17th for weight check.

## 2024-06-07 NOTE — Progress Notes (Unsigned)
 Pt current weight 244lbs Previous weight 241lbs .Pts reports she's currently taking medication as prescribed. Injection Day Tuesday and amount injected is 10mg .Munjaro no issues to report. Reports she has not been able take her Lasix  due to not following instructions as prescribed. She been taking one tablet instead of half.

## 2024-06-12 ENCOUNTER — Telehealth: Payer: Self-pay | Admitting: Internal Medicine

## 2024-06-12 ENCOUNTER — Other Ambulatory Visit: Payer: Medicare (Managed Care)

## 2024-06-12 DIAGNOSIS — E119 Type 2 diabetes mellitus without complications: Secondary | ICD-10-CM

## 2024-06-12 MED ORDER — SEMAGLUTIDE (1 MG/DOSE) 4 MG/3ML ~~LOC~~ SOPN
1.0000 mg | PEN_INJECTOR | SUBCUTANEOUS | 11 refills | Status: DC
Start: 2024-06-12 — End: 2024-07-12

## 2024-06-12 NOTE — Progress Notes (Signed)
 Interested in switching to Ozempic  to see if better weight loss and states it is covered.   Currently taking Mounjaro  10 mg weekly Will start at 1 mg weekly with Ozempic  Follow up in 2 weeks with weight check and glucose logs.

## 2024-06-12 NOTE — Telephone Encounter (Signed)
 Pt contacted.

## 2024-06-13 LAB — HEMOGLOBIN A1C
Est. average glucose Bld gHb Est-mCnc: 111 mg/dL
Hgb A1c MFr Bld: 5.5 % (ref 4.8–5.6)

## 2024-06-19 ENCOUNTER — Other Ambulatory Visit: Payer: Self-pay | Admitting: Internal Medicine

## 2024-06-21 ENCOUNTER — Other Ambulatory Visit (INDEPENDENT_AMBULATORY_CARE_PROVIDER_SITE_OTHER): Payer: Medicare (Managed Care)

## 2024-06-21 NOTE — Progress Notes (Signed)
 Go two more weeks with 1 mg of Ozempic  and return for weight check.   Does not need to continue with twice weekly sugar checks as A1C is in normal range. Viki notified her of normal A1C.

## 2024-06-21 NOTE — Progress Notes (Addendum)
 Pt current weight 241lbs. No change in weight.Reports she's currently taking medication as prescribed.She reports she took her second injection of Ozempic  1mg  yesterday reports no issues. Injection Day  Tuesday Start date 06/12/24. Other medications Jardiance  10mg  Lasix  20mg 

## 2024-07-10 ENCOUNTER — Other Ambulatory Visit: Payer: Medicare (Managed Care)

## 2024-07-10 NOTE — Progress Notes (Addendum)
 Pt current weight 239lbs. Previous weight 241lbs.Reports she's currently taking medication as prescribed.Dose amount Ozempic  1mg .Reports constipation and discomfort in vagina area.Injection Day Tuesday Start date 06/12/24. Other medications Jardiance  10mg  Lasix  20mg 

## 2024-07-12 ENCOUNTER — Encounter: Payer: Self-pay | Admitting: Internal Medicine

## 2024-07-12 ENCOUNTER — Ambulatory Visit (INDEPENDENT_AMBULATORY_CARE_PROVIDER_SITE_OTHER): Payer: Medicare (Managed Care) | Admitting: Internal Medicine

## 2024-07-12 VITALS — BP 138/90 | HR 91 | Resp 18 | Ht 61.0 in | Wt 243.0 lb

## 2024-07-12 DIAGNOSIS — E119 Type 2 diabetes mellitus without complications: Secondary | ICD-10-CM

## 2024-07-12 DIAGNOSIS — Z716 Tobacco abuse counseling: Secondary | ICD-10-CM | POA: Diagnosis not present

## 2024-07-12 DIAGNOSIS — E559 Vitamin D deficiency, unspecified: Secondary | ICD-10-CM | POA: Diagnosis not present

## 2024-07-12 DIAGNOSIS — E66813 Obesity, class 3: Secondary | ICD-10-CM

## 2024-07-12 DIAGNOSIS — F17200 Nicotine dependence, unspecified, uncomplicated: Secondary | ICD-10-CM | POA: Diagnosis not present

## 2024-07-12 DIAGNOSIS — I10 Essential (primary) hypertension: Secondary | ICD-10-CM

## 2024-07-12 DIAGNOSIS — L0292 Furuncle, unspecified: Secondary | ICD-10-CM

## 2024-07-12 DIAGNOSIS — Z780 Asymptomatic menopausal state: Secondary | ICD-10-CM

## 2024-07-12 DIAGNOSIS — Z6841 Body Mass Index (BMI) 40.0 and over, adult: Secondary | ICD-10-CM | POA: Diagnosis not present

## 2024-07-12 MED ORDER — SEMAGLUTIDE (2 MG/DOSE) 8 MG/3ML ~~LOC~~ SOPN: PEN_INJECTOR | SUBCUTANEOUS | 11 refills | Status: AC

## 2024-07-12 NOTE — Progress Notes (Signed)
 "   Subjective:    Patient ID: Terri Powers, female   DOB: 05-23-1956, 68 y.o.   MRN: [id number]   HPI   Obesity and DM:  Gave 5th weekly dose of 1 mg of Ozempic  yesteray.  Tolerating fine.  She is using a stool softener.  She does have miralax at home.  Has not had a stool in 4 days and stools have been hard.  Stool softener not working.   Has been away from physical activity for about 3 weeks.  She is doing some squats and hand weights for about 35 to 45 minutes daily.   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   A1C was 5.5% last month  2.  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  Her lady parts are now dry, however.  No discharge or itching.    3.  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  Only with symptoms during spring and fall and not in between.  She now only uses flonase  long term during spring and fall.    4.  Tobacco use disorder:  Still at 1/2 ppd.  Has not had CT screen for lungs.  Still not sure she is ready to quit as enjoys smoking.    5.  Hypertension:  Taking Amlodipine  and Metoprolol .    Current Meds  Medication Sig   Accu-Chek Softclix Lancets lancets Check blood glucose twice daily before meals   albuterol  (PROVENTIL ) (2.5 MG/3ML) 0.083% nebulizer solution Take 3 mLs (2.5 mg total) by nebulization every 4 (four) hours as needed for wheezing or shortness of breath.   albuterol  (VENTOLIN  HFA) 108 (90 Base) MCG/ACT inhaler 2 puffs every 6 hours as needed for wheezing   amLODipine  (NORVASC ) 10 MG tablet Take 1 tablet by mouth once daily   atorvastatin  (LIPITOR) 80 MG tablet Take 1 tablet by mouth once daily   Blood Glucose Monitoring Suppl (ACCU-CHEK GUIDE ME) w/Device KIT USE AS DIRECTED TO  TEST  BLOOD  SUGAR  TWICE  DAILY  BEFORE  MEALS   Calcium  Citrate 250 MG TABS 2 tabs by mouth twice daily   Cholecalciferol (VITAMIN D3) 25 MCG (1000 UT) CAPS Take 1 capsule (1,000 Units total) by  mouth daily.   empagliflozin  (JARDIANCE ) 10 MG TABS tablet Take 1 tablet (10 mg total) by mouth daily before breakfast.   fluticasone  (FLONASE ) 50 MCG/ACT nasal spray Place 2 sprays into both nostrils daily.   furosemide  (LASIX ) 20 MG tablet TAKE 1/2 TO 1 (ONE-HALF TO ONE) TABLET BY MOUTH ONCE DAILY IN  THE  MORNING  AS  NEEDED  FOR  ANKLE  AND  FOOT  SWELLING   glucose blood (ACCU-CHEK GUIDE) test strip Check blood glucose twice daily before meals   lansoprazole  (PREVACID ) 15 MG capsule TAKE 1 CAPSULE BY MOUTH ONCE DAILY 1/2  HOUR  BEFORE  BREAKFAST   levocetirizine (XYZAL ) 5 MG tablet Take 1 tablet (5 mg total) by mouth every evening.   Magnesium 250 MG TABS Take by mouth daily.   metoprolol  succinate (TOPROL -XL) 100 MG 24 hr tablet TAKE 1 TABLET BY MOUTH ONCE DAILY WITH MEALS OR  IMMEDIATELY  FOLLOWING  A  MEAL   montelukast  (SINGULAIR ) 10 MG tablet Take 1 tablet (10 mg total) by mouth at bedtime.   mupirocin  ointment (BACTROBAN ) 2 % Apply small amount twice daily to nares and skin lesion for 10 days.   oxybutynin  (  DITROPAN -XL) 5 MG 24 hr tablet TAKE 1 TABLET BY MOUTH AT BEDTIME   Semaglutide , 1 MG/DOSE, 4 MG/3ML SOPN Inject 1 mg as directed once a week.   Allergies  Allergen Reactions   Lisinopril  Cough    Side effect   Peach Flavoring Agent (Non-Screening) Hives   Shellfish Allergy     Allergy to seafood     Review of Systems    Objective:   BP (!) 138/90 (BP Location: Left Arm, Patient Position: Sitting, Cuff Size: Normal)   Pulse 91   Resp 18   Ht 5' 1 (1.549 m)   Wt 243 lb (110.2 kg)   BMI 45.91 kg/m   Physical Exam Constitutional:      Appearance: She is obese.  Eyes:     Extraocular Movements: Extraocular movements intact.     Pupils: Pupils are equal, round, and reactive to light.  Cardiovascular:     Rate and Rhythm: Normal rate and regular rhythm.     Pulses: Normal pulses.     Heart sounds: Normal heart sounds, S1 normal and S2 normal. No murmur heard.     No friction rub. No S3 or S4 sounds.  Pulmonary:     Effort: Pulmonary effort is normal.     Breath sounds: Normal breath sounds.  Musculoskeletal:     Cervical back: Normal range of motion and neck supple.     Right lower leg: No edema.     Left lower leg: No edema.      Assessment & Plan   Morbid obesity and DM:  Increase Ozempic  to 2 mg weekly.  No need to  check sugars as frequently now.  Continue Jardiance .  Nutrition appt with Dr. Fleta.  2.  Hypertension:   Hopefully, will see more weight loss and improvement in BP.  Encouraged consistent physical activity on daily basis.  3.  Furunculosis:  appears controlled now.  Stop using Chlorhexidine .  4.  Tobacco Use Disorder:  no real interest in quitting, though discussed options.  Lung CA CT screen.    5.  Multiple risk factors for osteoporosis, including history of Vitamin D  def, smoking, lack of physical activity, postmenopausal:  DEXA   6.  Allergies:  controllled. "

## 2024-07-14 ENCOUNTER — Ambulatory Visit: Payer: Medicare (Managed Care) | Admitting: Internal Medicine

## 2024-07-14 ENCOUNTER — Encounter: Payer: Self-pay | Admitting: Internal Medicine

## 2024-07-14 VITALS — Ht 61.0 in | Wt 243.0 lb

## 2024-07-14 DIAGNOSIS — Z6841 Body Mass Index (BMI) 40.0 and over, adult: Secondary | ICD-10-CM

## 2024-07-14 DIAGNOSIS — E119 Type 2 diabetes mellitus without complications: Secondary | ICD-10-CM

## 2024-07-14 DIAGNOSIS — I10 Essential (primary) hypertension: Secondary | ICD-10-CM

## 2024-07-14 NOTE — Progress Notes (Cosign Needed)
 I, MD In training, saw pt today. Dr. Adella not on site.  We have pre-discussed pt and she will see my note and make changes if needed.  Care is of a nature that can be done by ancillary staff.       CC- Not losing enough weight  HPI- Pt scheduled by Dr. Adella for inadequate wt loss.  Pt say she has lost 30 pounds over a 10 month period. Review of BMI's on EPIC for past 3 months reveals no change. She was on [place], then was changed over to Ozempec.   Ozempec was increased to 2 two days ago.   Pt's personal goal is to lose another 40 pounds and get to a size 14.  But she does not want to lose too much weight because she likes to have a certain look.   She had considered Gastric bypass but now she thinks at older ages, one should not have too many surgeries.  So for now she does not want gastric bypass. She understands that wt loss would help her cholesterol, BP and DM. Further her family has DM, CHF and she does not want to leave the world like they did.  Feels OK with her look at this weight; she has accepted herself.   She says if she had to pick her look or health, she would pick health over looks.  Some concerns that the wt loss meds would leave her face long and her skin not as healthy looking based on her observation of her friends.    Never tried serious dietary modification before.  Cravings: cheese and salt.  Goes to chips for salt. Sleep is restless.  Not fully satisfied with her sleep.  No snoring or gurgling in the middle of the night. No daytime sleepiness. No sleep studies.  Husband helps her work out at home. Finds gym intimidating.   All the wt came on over a year after her last marriage, when she went from size 10 to 20 in 1 year  Her friends have lost more wt on the Ozempec/[place]jaro than her.Drinks 3 28-32 oz bottles of water a day.   PMH- reviewed on EPIC.  Sig for DM, HTN.  Hgb A1c's have been normal on meds for past few months  Sohx- +Tobacco, Married. Husband is not  overweight.   Helps her with exercise.  Allergies  Allergen Reactions   Lisinopril  Cough    Side effect   Peach Flavoring Agent (Non-Screening) Hives   Shellfish Allergy     Allergy to seafood     Meds - Ozempec, Jardiance , calcium  600 mg every day, vit D 3 1000 units per day.  Other meds reviewed on EPIC.   Pex- Smiling, interactive, NAD, normal judgement, insight, mood, affect.  BMI 46 for past 3 months.  Other vitals not measured today as this is a counciling session.   A/P High body weight that has threatened health (has  DM, HTN, High Cholesterol, maybe sleep) - Discussed Benefits of wt loss vs no wt loss.  Pt feels more motivated to lose weight.  -  Pt agreed to do a food/beverage diary over a 1 week period for more accurate assessment of her diet.  - RTC in 1 week so we can modify he diet based on the diary.  - Has personal goal of going to size 14, and losing 40 pounds.  She does not want to go below that at this time. We will honor that. - Increase Calcium   from 600 every day to 600 bid to handle cheese cravings - Deal with salt cravings with some salt rather than potato chips.   Drinks a lot of water that might be causing the craving.    RTC in 1 week with food diary with Caron Kaiser.

## 2024-07-17 ENCOUNTER — Other Ambulatory Visit: Payer: Self-pay | Admitting: Internal Medicine

## 2024-07-17 DIAGNOSIS — Z1231 Encounter for screening mammogram for malignant neoplasm of breast: Secondary | ICD-10-CM

## 2024-07-21 ENCOUNTER — Ambulatory Visit: Payer: Medicare (Managed Care) | Admitting: Internal Medicine

## 2024-07-21 DIAGNOSIS — I10 Essential (primary) hypertension: Secondary | ICD-10-CM

## 2024-07-21 DIAGNOSIS — E119 Type 2 diabetes mellitus without complications: Secondary | ICD-10-CM

## 2024-07-21 DIAGNOSIS — Z6841 Body Mass Index (BMI) 40.0 and over, adult: Secondary | ICD-10-CM

## 2024-07-21 NOTE — Progress Notes (Signed)
 Patient ID: Terri Powers, female   DOB: 06-16-1956, 68 y.o.   MRN: [id number] I, doc in training, saw pt. Dr. Adella no on site. We discussed pt ahead of time and she will see this note.  The nature of the visit is not of a highly medical nature to require direct supervision and is at the level of a clinic protocol.  No charge for visit.  CC- nutrition education  follow- up.   HPI- Seen last week: we had discussed benefits of weight loss and pt was given a food diary. She brings food diary in today.  Compliant with filling it out. She realized she is eating more than she had expected.   About 1600 cal/day on average. Range 1495- 2000 cal/lday.   Food Diary reviewed: Protein intake OK mainly fast food/processed sometimes.    Fresh vegetable intake is 0.   Fresh fruit 2 cups a day.   No dairy noted but on Calcium  600mg  bid and Vitamine D 800 units per day.   No good fats.  PMH- significant for DM on Ozempec. Compliant.  Scheduled for wt check next fri 930am  Pex- BMI 46 Looks well. NAD. Normal judgment and mentation. Ambulatory. Psych- says she is happy and satisfied with her life   Sohx- Lives with her husband. 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  A/P-  Malnutrition Obesity H/O DM  1- Keep diet as it is but add 5 cups of fresh veggies /day in the form of a salad, and take good fat in.  Since she does not like avocado, she can use walnuts, uncooked olive oil, uncooked canola oil ( if she cannot afford olive oil) for Omega fats.  She was given another diet log. I will check it next week when she comes for her weight check.  Pt understands and agrees with plan.

## 2024-07-24 ENCOUNTER — Other Ambulatory Visit: Payer: Medicare (Managed Care)

## 2024-07-28 ENCOUNTER — Other Ambulatory Visit: Payer: Medicare (Managed Care)

## 2024-07-28 ENCOUNTER — Ambulatory Visit: Payer: Medicare (Managed Care) | Admitting: Internal Medicine

## 2024-08-04 ENCOUNTER — Encounter: Payer: Self-pay | Admitting: Internal Medicine

## 2024-08-04 ENCOUNTER — Ambulatory Visit (INDEPENDENT_AMBULATORY_CARE_PROVIDER_SITE_OTHER): Payer: Medicare (Managed Care) | Admitting: Internal Medicine

## 2024-08-04 DIAGNOSIS — E119 Type 2 diabetes mellitus without complications: Secondary | ICD-10-CM | POA: Diagnosis not present

## 2024-08-04 DIAGNOSIS — E66813 Obesity, class 3: Secondary | ICD-10-CM | POA: Diagnosis not present

## 2024-08-04 DIAGNOSIS — I10 Essential (primary) hypertension: Secondary | ICD-10-CM

## 2024-08-04 DIAGNOSIS — Z6841 Body Mass Index (BMI) 40.0 and over, adult: Secondary | ICD-10-CM | POA: Diagnosis not present

## 2024-08-04 NOTE — Progress Notes (Signed)
  Fri Clinic Lifestyle Modification note.  Dr. Adella will be informed by sending Terri this note. Not directly supervised by Dr. Adella.  Subjective:    Patient ID: Terri Powers, female    DOB: 04-25-1956, 68 y.o.   MRN: [id number] CC- Dietary Modification   HPI - Terri Powers is here with food log, week 3.    -She had 2 celebrations where she was put in a position to have high caloric intake about 1700 cal/day.    -Caloric intake is 1100-1600/day, mostly 1600.    -Has been unable to make herself eat fresh veggies as advised.  Says she has had difficulty with it because as a kid she was forced to eat veggies.  Now she is gonna do what she wants.    -Ok if we add supplements until she can bring herself to eat fresh veggies.  She has no sourcs of vitamine C except grapes. -Protein intake has improved:  20-40 g/day.     -Tempted by friends and husband to eat such things as honey buns and sweet potato pies. -[employer] 2 times this week, again where she ate a lot.  -We had discussed pre planning meals; has not been able to do this -Water intake good 2 24 oz/day. -Also talked about body types she admired: Terri Powers and Terri Powers, checking to see if she admires anyone overweight.      Objective:   Physical Exam NAD.Good mood and affect.      Assessment & Plan:   Dietary Modification for Malnutrition/DM/Obesity/unable to lose weight .   1- She had a mental barrier about taking veggies.  After discussing it, she  said maybe she can do 2 veggies a day;  she said she would rather take vitamine C tablets until she can get herself to eat fresh veggies.   --pt wants to take Multi-vitamine containing Vitamine C   --she is to check the calcium  content of the multi-vitmine to make sure she does not exceed 1200mg  Ca/day and Vitamine D 800 U/Day  --Re-encouraged to add the good fat.  She is willing to add olive oil for now.   -- Pt inquired about a caloric daily goal - She was advised to keep it  consistently at 1200 cal/day to bring about weight loss as she is doing about 1600 cal/day with no weight loss.   ( Based on UP TO Date data).   --She was told that she can eat as much vegetables as she wants as it is low in calories, cooked and raw, to satisfy the wanting to be full feeling.   -- Re educated on how to measure protein amount in food so she can get 20-25 grams/day.  -- Continue current water intake.   Goal is to get Terri to do 1200 cal /day consistently, do 2-3 veggies at least/day, do the good oil, and adequate protein without affecting Terri quality of life negatively. She wants to lose 30 pounds.   At this point she is learning about foods and making small changes. Continue dietary log for 2 weeks and measure wt when she has made the changes consistently for 3 weeks.   As Terri family and friends are able to tempt Terri into eating sweets, she might benefit from drilling Terri to say  NO ( UP to Date reference).  Can work on this later.   Will see Terri in 2 weeks with Terri log.

## 2024-08-11 ENCOUNTER — Other Ambulatory Visit (INDEPENDENT_AMBULATORY_CARE_PROVIDER_SITE_OTHER): Payer: Medicare (Managed Care)

## 2024-08-11 NOTE — Progress Notes (Signed)
 Lost 5 lbs Have her return in 2-4 weeks for weight check

## 2024-08-11 NOTE — Addendum Note (Signed)
 Addended by: MONETTA SPRAGUE R on: 08/11/2024 01:05 PM   Modules accepted: Level of Service

## 2024-08-17 ENCOUNTER — Ambulatory Visit
Admission: RE | Admit: 2024-08-17 | Discharge: 2024-08-17 | Disposition: A | Discharge status: Home / Self Care | Payer: Medicare (Managed Care) | Source: Ambulatory Visit | Attending: Internal Medicine | Admitting: Internal Medicine

## 2024-08-17 DIAGNOSIS — Z1231 Encounter for screening mammogram for malignant neoplasm of breast: Secondary | ICD-10-CM

## 2024-08-18 ENCOUNTER — Ambulatory Visit: Payer: Medicare (Managed Care) | Admitting: Internal Medicine

## 2024-09-08 ENCOUNTER — Ambulatory Visit: Payer: Medicare (Managed Care) | Admitting: Internal Medicine

## 2024-09-11 ENCOUNTER — Telehealth: Payer: Self-pay

## 2024-09-13 ENCOUNTER — Telehealth: Payer: Self-pay

## 2024-09-13 NOTE — Telephone Encounter (Cosign Needed)
 Called Ms. Rosales as to why she did not come to weight management clinic with me last Fri.   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     She is doing good with her eating, tolerating DM meds,  and her sugars are good according to her at home,  In October, 2025, she lost 5 pounds. In Nov, 2025, no weights measure.  No show. She is willing to come to Nutrition Clinic with me and for weight check next month when seasonal job is done.  Will put note to Erminio to make appt and call her with it for next month.

## 2024-09-19 NOTE — Telephone Encounter (Signed)
 Left a voicemail to patient asking to return call.

## 2024-09-19 NOTE — Telephone Encounter (Signed)
 Left voicemail to patient asking to return call.

## 2024-09-21 NOTE — Telephone Encounter (Signed)
 Patient has been scheduled for 10/14/23 at 9:00 am.

## 2024-09-21 NOTE — Telephone Encounter (Signed)
 Spoke with patient, patient had called to reschedule an appointment for nutrition that she had previously cancelled, also patient wanted to reschedule a weight check appointment.   Patient has been scheduled for 10/14/23 at 9:00 am.

## 2024-10-13 ENCOUNTER — Ambulatory Visit: Payer: Self-pay | Admitting: Internal Medicine

## 2024-10-13 ENCOUNTER — Encounter: Payer: Self-pay | Admitting: Internal Medicine

## 2024-10-13 VITALS — Ht 61.0 in | Wt 238.0 lb

## 2024-10-13 DIAGNOSIS — E66813 Obesity, class 3: Secondary | ICD-10-CM

## 2024-10-13 DIAGNOSIS — Z713 Dietary counseling and surveillance: Secondary | ICD-10-CM

## 2024-10-13 DIAGNOSIS — E119 Type 2 diabetes mellitus without complications: Secondary | ICD-10-CM

## 2024-10-13 NOTE — Progress Notes (Signed)
 NO CHARGE VISIT: I, MD in training, saw pt in the capacity of ancillary staff. Not directly supervised by Dr. Adella.  Note sent to her for review. Chief Complaint  Patient presents with   Obesity    Follow-up   HPI Says feels better. Energy has increased. Blood sugar have been less than 111 at home on Ozempic . Jardiance  not being taken because of vaginal side effects.   Says she fell off the wagon and ate a lot during the holidays. 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 Life is great.  Says she is a Foodie and [employer] and the mother figure.  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  Physical Exam In NAD. In good spirits. Wt 238  Last weight 238   A/P 1- BMI 45; wt 238 No change at this point. 2- DM  Pt education done: what is protein, good fat, dairy, fiber, carbohydrates and what are the sources of these items.  What is calorie and how it related to weight.  Sounds like  the dietary regimen we had discussed was  too hard for the patient as far as the calorie restriction.  Recalculated a new calorie/day that is easier today using UP-TO_Date formula (Weight multiplied by 10) subtract [phone] for weight loss.  (238 times 10) - 500 = 1800 calories/day  50-70 g of protein 4 tablespoon of good fat; olive oil uncooked or 1/2 cup nuts. Takes calcium  600 bid, multi one-a-day that has B vitamines 8 glasses of water a day Preplanned her upcoming dietary regime that works with the new job she is getting.    She is to do the same thing every day to keep things simple for now:  Bfast: 2 eggs per day with 1 tablespoon olive oil or canola oil, 1 green apple, water, salt Lunch- 3 rolls of turkey slices with cheese; although processed, it would have to do. Snack- carrots and hummus Dinner- meat, salad, 3 tablespoons of good fat. Drilled saying NO to people offering her tempting food. See in 3 weeks to see how this  calorie level is doing for her.   RTC in 3 weeks.

## 2024-10-23 NOTE — Progress Notes (Signed)
 Patient seen at my direction for nutrition counseling with Dr. Fleta

## 2024-10-23 NOTE — Progress Notes (Signed)
 Under my direction, patient seen for nutrition counseling with Dr. Fleta

## 2024-10-26 ENCOUNTER — Other Ambulatory Visit: Payer: Self-pay | Admitting: Internal Medicine

## 2024-10-31 NOTE — Progress Notes (Signed)
 Patient seen at my direction with Dr. Fleta for nutrition counseling

## 2024-11-03 ENCOUNTER — Encounter: Payer: Self-pay | Admitting: Internal Medicine

## 2024-11-03 ENCOUNTER — Ambulatory Visit: Admitting: Internal Medicine

## 2024-11-03 VITALS — Wt 238.0 lb

## 2024-11-03 NOTE — Progress Notes (Unsigned)
" ° °  Subjective:    Patient ID: Terri Powers, female    DOB: [birth year], 69 y.o.   MRN: [id number] Pt seen at Dr. Felix request- no charge visit  Chief Complaint  Patient presents with   Nutrition Counseling      Pt says she was unable to be disciplined about diet. She loves to bake and eat sweets. Cannot think of alternate activities.   Blood sugars are doing good. Has been exercising more.  Cost of med increased. It went from 0 to 297 dollars this month. Has no questions   Review of Systems     Objective:   Physical Exam Vitals and nursing note reviewed.  Constitutional:      Comments: High BMI  Neurological:     Mental Status: She is alert.    Weight 238 pounds     Assessment & Plan:  1- High weight/DM- NO change in weight so far.  Already educated on nutrition during past visits. Not able to maintain discipline.  Calorie recommendation 1800/day.  - persist until able to achieve discipline -continue exercise - f/u with Dr. Adella. Maybe Ozempec can be increased if finance not a problem.  RTC as scheduled.     "

## 2024-11-03 NOTE — Progress Notes (Deleted)
 SABRA

## 2025-01-03 ENCOUNTER — Other Ambulatory Visit (HOSPITAL_BASED_OUTPATIENT_CLINIC_OR_DEPARTMENT_OTHER): Payer: Medicare (Managed Care)

## 2025-02-06 ENCOUNTER — Encounter: Payer: Self-pay | Admitting: Internal Medicine
